# Patient Record
Sex: Male | Born: 1940 | Race: White | Hispanic: No | Smoking: Former smoker
Health system: Southern US, Community
[De-identification: ages and names within clinical notes are randomized; demographics above are authoritative.]

## PROBLEM LIST (undated history)

## (undated) DIAGNOSIS — I10 Essential (primary) hypertension: Secondary | ICD-10-CM

## (undated) DIAGNOSIS — F329 Major depressive disorder, single episode, unspecified: Secondary | ICD-10-CM

## (undated) DIAGNOSIS — N189 Chronic kidney disease, unspecified: Secondary | ICD-10-CM

## (undated) DIAGNOSIS — E785 Hyperlipidemia, unspecified: Secondary | ICD-10-CM

## (undated) DIAGNOSIS — K219 Gastro-esophageal reflux disease without esophagitis: Secondary | ICD-10-CM

## (undated) DIAGNOSIS — F32A Depression, unspecified: Secondary | ICD-10-CM

## (undated) DIAGNOSIS — E119 Type 2 diabetes mellitus without complications: Secondary | ICD-10-CM

## (undated) HISTORY — DX: Essential (primary) hypertension: I10

## (undated) HISTORY — DX: Depression, unspecified: F32.A

## (undated) HISTORY — PX: TONSILLECTOMY: SUR1361

## (undated) HISTORY — DX: Type 2 diabetes mellitus without complications: E11.9

## (undated) HISTORY — DX: Hyperlipidemia, unspecified: E78.5

## (undated) HISTORY — DX: Major depressive disorder, single episode, unspecified: F32.9

## (undated) HISTORY — DX: Gastro-esophageal reflux disease without esophagitis: K21.9

## (undated) HISTORY — DX: Chronic kidney disease, unspecified: N18.9

---

## 1999-12-24 ENCOUNTER — Encounter: Payer: Self-pay | Admitting: Pulmonary Disease

## 1999-12-24 ENCOUNTER — Ambulatory Visit (HOSPITAL_COMMUNITY): Admission: RE | Admit: 1999-12-24 | Discharge: 1999-12-24 | Payer: Self-pay | Admitting: Pulmonary Disease

## 2001-04-14 ENCOUNTER — Ambulatory Visit (HOSPITAL_COMMUNITY): Admission: RE | Admit: 2001-04-14 | Discharge: 2001-04-14 | Payer: Self-pay | Admitting: Pulmonary Disease

## 2001-04-14 ENCOUNTER — Encounter: Payer: Self-pay | Admitting: Pulmonary Disease

## 2004-09-23 ENCOUNTER — Ambulatory Visit: Payer: Self-pay | Admitting: Pulmonary Disease

## 2005-03-15 ENCOUNTER — Ambulatory Visit: Payer: Self-pay | Admitting: Pulmonary Disease

## 2005-06-21 ENCOUNTER — Ambulatory Visit: Payer: Self-pay | Admitting: Pulmonary Disease

## 2005-07-16 ENCOUNTER — Ambulatory Visit: Payer: Self-pay | Admitting: Pulmonary Disease

## 2007-02-03 ENCOUNTER — Ambulatory Visit: Payer: Self-pay | Admitting: Pulmonary Disease

## 2007-02-03 LAB — CONVERTED CEMR LAB
ALT: 42 units/L (ref 0–53)
AST: 32 units/L (ref 0–37)
Albumin: 4 g/dL (ref 3.5–5.2)
Alkaline Phosphatase: 42 units/L (ref 39–117)
BUN: 17 mg/dL (ref 6–23)
Basophils Absolute: 0 10*3/uL (ref 0.0–0.1)
Basophils Relative: 0.6 % (ref 0.0–1.0)
Bilirubin, Direct: 0.1 mg/dL (ref 0.0–0.3)
CO2: 29 meq/L (ref 19–32)
Calcium: 9.2 mg/dL (ref 8.4–10.5)
Chloride: 107 meq/L (ref 96–112)
Cholesterol: 200 mg/dL (ref 0–200)
Creatinine, Ser: 1.1 mg/dL (ref 0.4–1.5)
Eosinophils Absolute: 0.1 10*3/uL (ref 0.0–0.6)
Eosinophils Relative: 2.5 % (ref 0.0–5.0)
GFR calc Af Amer: 86 mL/min
GFR calc non Af Amer: 71 mL/min
Glucose, Bld: 135 mg/dL — ABNORMAL HIGH (ref 70–99)
HCT: 39.6 % (ref 39.0–52.0)
HDL: 35.1 mg/dL — ABNORMAL LOW (ref >39.0)
Hemoglobin: 13.9 g/dL (ref 13.0–17.0)
Hgb A1c MFr Bld: 5.8 % (ref 4.6–6.0)
LDL Cholesterol: 137 mg/dL — ABNORMAL HIGH (ref 0–99)
Lymphocytes Relative: 40 % (ref 12.0–46.0)
MCHC: 35 g/dL (ref 30.0–36.0)
MCV: 92.6 fL (ref 78.0–100.0)
Monocytes Absolute: 0.4 10*3/uL (ref 0.2–0.7)
Monocytes Relative: 9.2 % (ref 3.0–11.0)
Neutro Abs: 2 10*3/uL (ref 1.4–7.7)
Neutrophils Relative %: 47.7 % (ref 43.0–77.0)
PSA: 0.88 ng/mL (ref 0.10–4.00)
Platelets: 166 10*3/uL (ref 150–400)
Potassium: 4.1 meq/L (ref 3.5–5.1)
RBC: 4.28 M/uL (ref 4.22–5.81)
RDW: 11.9 % (ref 11.5–14.6)
Sodium: 143 meq/L (ref 135–145)
TSH: 0.92 microintl units/mL (ref 0.35–5.50)
Total Bilirubin: 1 mg/dL (ref 0.3–1.2)
Total CHOL/HDL Ratio: 5.7
Total Protein: 6.4 g/dL (ref 6.0–8.3)
Triglycerides: 139 mg/dL (ref 0–149)
VLDL: 28 mg/dL (ref 0–40)
Vit D, 1,25-Dihydroxy: 20 (ref 20–57)
WBC: 4.1 10*3/uL — ABNORMAL LOW (ref 4.5–10.5)

## 2007-07-22 ENCOUNTER — Encounter: Payer: Self-pay | Admitting: Pulmonary Disease

## 2007-10-09 ENCOUNTER — Telehealth: Payer: Self-pay | Admitting: Pulmonary Disease

## 2007-10-23 ENCOUNTER — Ambulatory Visit: Payer: Self-pay | Admitting: Pulmonary Disease

## 2007-10-23 DIAGNOSIS — E7800 Pure hypercholesterolemia, unspecified: Secondary | ICD-10-CM

## 2007-10-23 DIAGNOSIS — M545 Low back pain, unspecified: Secondary | ICD-10-CM | POA: Insufficient documentation

## 2007-10-23 DIAGNOSIS — K219 Gastro-esophageal reflux disease without esophagitis: Secondary | ICD-10-CM

## 2007-10-23 DIAGNOSIS — M1990 Unspecified osteoarthritis, unspecified site: Secondary | ICD-10-CM | POA: Insufficient documentation

## 2007-10-23 DIAGNOSIS — G500 Trigeminal neuralgia: Secondary | ICD-10-CM | POA: Insufficient documentation

## 2007-10-23 DIAGNOSIS — Z9189 Other specified personal risk factors, not elsewhere classified: Secondary | ICD-10-CM | POA: Insufficient documentation

## 2007-10-23 DIAGNOSIS — F339 Major depressive disorder, recurrent, unspecified: Secondary | ICD-10-CM

## 2007-10-23 DIAGNOSIS — M5136 Other intervertebral disc degeneration, lumbar region: Secondary | ICD-10-CM

## 2007-10-23 LAB — CONVERTED CEMR LAB
ALT: 63 units/L — ABNORMAL HIGH (ref 0–53)
AST: 44 units/L — ABNORMAL HIGH (ref 0–37)
Albumin: 4 g/dL (ref 3.5–5.2)
Alkaline Phosphatase: 33 units/L — ABNORMAL LOW (ref 39–117)
BUN: 23 mg/dL (ref 6–23)
Basophils Absolute: 0 10*3/uL (ref 0.0–0.1)
Basophils Relative: 0.5 % (ref 0.0–1.0)
Bilirubin, Direct: 0.2 mg/dL (ref 0.0–0.3)
CO2: 31 meq/L (ref 19–32)
Calcium: 9 mg/dL (ref 8.4–10.5)
Chloride: 107 meq/L (ref 96–112)
Cholesterol: 153 mg/dL (ref 0–200)
Creatinine, Ser: 1 mg/dL (ref 0.4–1.5)
Eosinophils Absolute: 0.1 10*3/uL (ref 0.0–0.7)
Eosinophils Relative: 1.6 % (ref 0.0–5.0)
GFR calc Af Amer: 96 mL/min
GFR calc non Af Amer: 79 mL/min
Glucose, Bld: 132 mg/dL — ABNORMAL HIGH (ref 70–99)
HCT: 42.1 % (ref 39.0–52.0)
HDL: 44.5 mg/dL (ref >39.0)
Hemoglobin: 14.1 g/dL (ref 13.0–17.0)
Hgb A1c MFr Bld: 6.3 % — ABNORMAL HIGH (ref 4.6–6.0)
LDL Cholesterol: 91 mg/dL (ref 0–99)
Lymphocytes Relative: 28.6 % (ref 12.0–46.0)
MCHC: 33.5 g/dL (ref 30.0–36.0)
MCV: 91.8 fL (ref 78.0–100.0)
Monocytes Absolute: 0.5 10*3/uL (ref 0.1–1.0)
Monocytes Relative: 8.5 % (ref 3.0–12.0)
Neutro Abs: 3.3 10*3/uL (ref 1.4–7.7)
Neutrophils Relative %: 60.8 % (ref 43.0–77.0)
PSA: 0.65 ng/mL (ref 0.10–4.00)
Platelets: 188 10*3/uL (ref 150–400)
Potassium: 5.1 meq/L (ref 3.5–5.1)
RBC: 4.59 M/uL (ref 4.22–5.81)
RDW: 12.3 % (ref 11.5–14.6)
Sodium: 143 meq/L (ref 135–145)
TSH: 0.7 microintl units/mL (ref 0.35–5.50)
Total Bilirubin: 0.8 mg/dL (ref 0.3–1.2)
Total CHOL/HDL Ratio: 3.4
Total Protein: 6.7 g/dL (ref 6.0–8.3)
Triglycerides: 88 mg/dL (ref 0–149)
VLDL: 18 mg/dL (ref 0–40)
WBC: 5.4 10*3/uL (ref 4.5–10.5)

## 2007-10-24 ENCOUNTER — Telehealth (INDEPENDENT_AMBULATORY_CARE_PROVIDER_SITE_OTHER): Payer: Self-pay | Admitting: *Deleted

## 2007-10-27 DIAGNOSIS — G43809 Other migraine, not intractable, without status migrainosus: Secondary | ICD-10-CM

## 2007-10-27 DIAGNOSIS — N4889 Other specified disorders of penis: Secondary | ICD-10-CM | POA: Insufficient documentation

## 2007-10-30 ENCOUNTER — Telehealth (INDEPENDENT_AMBULATORY_CARE_PROVIDER_SITE_OTHER): Payer: Self-pay | Admitting: *Deleted

## 2007-12-12 ENCOUNTER — Ambulatory Visit: Payer: Self-pay | Admitting: Endocrinology

## 2007-12-12 DIAGNOSIS — E119 Type 2 diabetes mellitus without complications: Secondary | ICD-10-CM | POA: Insufficient documentation

## 2007-12-12 DIAGNOSIS — R259 Unspecified abnormal involuntary movements: Secondary | ICD-10-CM | POA: Insufficient documentation

## 2008-03-14 ENCOUNTER — Ambulatory Visit: Payer: Self-pay | Admitting: Endocrinology

## 2008-03-14 LAB — CONVERTED CEMR LAB: Hgb A1c MFr Bld: 6.2 % — ABNORMAL HIGH (ref 4.6–6.0)

## 2008-04-30 ENCOUNTER — Ambulatory Visit: Payer: Self-pay | Admitting: Pulmonary Disease

## 2008-04-30 DIAGNOSIS — J209 Acute bronchitis, unspecified: Secondary | ICD-10-CM

## 2008-05-12 DIAGNOSIS — J189 Pneumonia, unspecified organism: Secondary | ICD-10-CM

## 2008-05-12 DIAGNOSIS — E669 Obesity, unspecified: Secondary | ICD-10-CM

## 2008-05-12 LAB — CONVERTED CEMR LAB
ALT: 35 units/L (ref 0–53)
AST: 28 units/L (ref 0–37)
Albumin: 4.2 g/dL (ref 3.5–5.2)
Alkaline Phosphatase: 40 units/L (ref 39–117)
BUN: 19 mg/dL (ref 6–23)
Basophils Absolute: 0.1 10*3/uL (ref 0.0–0.1)
Basophils Relative: 0.9 % (ref 0.0–3.0)
Bilirubin, Direct: 0.2 mg/dL (ref 0.0–0.3)
CO2: 30 meq/L (ref 19–32)
Calcium: 9.7 mg/dL (ref 8.4–10.5)
Chloride: 105 meq/L (ref 96–112)
Creatinine, Ser: 1.2 mg/dL (ref 0.4–1.5)
Eosinophils Absolute: 0.4 10*3/uL (ref 0.0–0.7)
Eosinophils Relative: 5.2 % — ABNORMAL HIGH (ref 0.0–5.0)
GFR calc Af Amer: 78 mL/min
GFR calc non Af Amer: 64 mL/min
Glucose, Bld: 212 mg/dL — ABNORMAL HIGH (ref 70–99)
HCT: 42.1 % (ref 39.0–52.0)
Hemoglobin: 14.6 g/dL (ref 13.0–17.0)
Lymphocytes Relative: 15.6 % (ref 12.0–46.0)
MCHC: 34.6 g/dL (ref 30.0–36.0)
MCV: 93.1 fL (ref 78.0–100.0)
Monocytes Absolute: 0.8 10*3/uL (ref 0.1–1.0)
Monocytes Relative: 9.2 % (ref 3.0–12.0)
Neutro Abs: 5.9 10*3/uL (ref 1.4–7.7)
Neutrophils Relative %: 69.1 % (ref 43.0–77.0)
Platelets: 190 10*3/uL (ref 150–400)
Potassium: 4.8 meq/L (ref 3.5–5.1)
RBC: 4.52 M/uL (ref 4.22–5.81)
RDW: 11.8 % (ref 11.5–14.6)
Sodium: 141 meq/L (ref 135–145)
Total Bilirubin: 0.7 mg/dL (ref 0.3–1.2)
Total Protein: 6.9 g/dL (ref 6.0–8.3)
WBC: 8.5 10*3/uL (ref 4.5–10.5)

## 2008-11-04 ENCOUNTER — Telehealth (INDEPENDENT_AMBULATORY_CARE_PROVIDER_SITE_OTHER): Payer: Self-pay | Admitting: *Deleted

## 2008-11-15 ENCOUNTER — Encounter: Payer: Self-pay | Admitting: Pulmonary Disease

## 2008-11-27 ENCOUNTER — Encounter: Payer: Self-pay | Admitting: Pulmonary Disease

## 2008-12-20 ENCOUNTER — Ambulatory Visit: Payer: Self-pay | Admitting: Endocrinology

## 2008-12-20 LAB — CONVERTED CEMR LAB: Hgb A1c MFr Bld: 6.8 % — ABNORMAL HIGH (ref 4.6–6.5)

## 2009-01-08 ENCOUNTER — Ambulatory Visit: Payer: Self-pay | Admitting: Internal Medicine

## 2009-01-08 ENCOUNTER — Telehealth (INDEPENDENT_AMBULATORY_CARE_PROVIDER_SITE_OTHER): Payer: Self-pay | Admitting: *Deleted

## 2009-01-08 DIAGNOSIS — R05 Cough: Secondary | ICD-10-CM

## 2009-01-08 DIAGNOSIS — R5383 Other fatigue: Secondary | ICD-10-CM

## 2009-01-08 DIAGNOSIS — R5381 Other malaise: Secondary | ICD-10-CM

## 2009-01-08 DIAGNOSIS — R351 Nocturia: Secondary | ICD-10-CM

## 2009-01-09 ENCOUNTER — Ambulatory Visit: Payer: Self-pay | Admitting: Internal Medicine

## 2009-01-09 LAB — CONVERTED CEMR LAB
ALT: 27 units/L (ref 0–53)
AST: 27 units/L (ref 0–37)
Albumin: 3.9 g/dL (ref 3.5–5.2)
Basophils Relative: 0.9 % (ref 0.0–3.0)
Eosinophils Relative: 0.7 % (ref 0.0–5.0)
GFR calc non Af Amer: 63.91 mL/min (ref >60)
Glucose, Bld: 84 mg/dL (ref 70–99)
HCT: 40.8 % (ref 39.0–52.0)
Hemoglobin: 14.1 g/dL (ref 13.0–17.0)
Ketones, ur: NEGATIVE mg/dL
Leukocytes, UA: NEGATIVE
Lymphs Abs: 1.8 10*3/uL (ref 0.7–4.0)
Monocytes Relative: 10.2 % (ref 3.0–12.0)
Neutro Abs: 4.9 10*3/uL (ref 1.4–7.7)
Potassium: 4.7 meq/L (ref 3.5–5.1)
Sodium: 144 meq/L (ref 135–145)
TSH: 0.72 microintl units/mL (ref 0.35–5.50)
Urobilinogen, UA: 0.2 (ref 0.0–1.0)
WBC: 7.7 10*3/uL (ref 4.5–10.5)

## 2009-01-14 ENCOUNTER — Encounter: Payer: Self-pay | Admitting: Pulmonary Disease

## 2009-03-04 ENCOUNTER — Ambulatory Visit: Payer: Self-pay | Admitting: Endocrinology

## 2009-04-04 ENCOUNTER — Encounter: Payer: Self-pay | Admitting: Pulmonary Disease

## 2009-04-15 ENCOUNTER — Telehealth (INDEPENDENT_AMBULATORY_CARE_PROVIDER_SITE_OTHER): Payer: Self-pay | Admitting: *Deleted

## 2009-04-24 ENCOUNTER — Ambulatory Visit: Payer: Self-pay | Admitting: Pulmonary Disease

## 2009-04-24 DIAGNOSIS — K5730 Diverticulosis of large intestine without perforation or abscess without bleeding: Secondary | ICD-10-CM | POA: Insufficient documentation

## 2009-06-02 ENCOUNTER — Telehealth: Payer: Self-pay | Admitting: Pulmonary Disease

## 2009-07-25 ENCOUNTER — Encounter: Payer: Self-pay | Admitting: Pulmonary Disease

## 2010-02-26 ENCOUNTER — Ambulatory Visit: Payer: Self-pay | Admitting: Endocrinology

## 2010-02-26 LAB — CONVERTED CEMR LAB
Creatinine,U: 79.3 mg/dL
Hgb A1c MFr Bld: 5.9 % (ref 4.6–6.5)
Microalb Creat Ratio: 1.4 mg/g (ref 0.0–30.0)

## 2010-03-10 ENCOUNTER — Encounter: Payer: Self-pay | Admitting: Pulmonary Disease

## 2010-08-18 NOTE — Letter (Signed)
Summary: [facility]  [facility]   Imported By: Lester [place] 08/16/2009 11:31:26  _____________________________________________________________________  External Attachment:    Type:   Image     Comment:   External Document

## 2010-08-18 NOTE — Assessment & Plan Note (Signed)
Summary: F/U APPT/#/CD   Vital Signs:  Patient profile:   70 year old male Height:      69 inches (175.26 cm) Weight:      211.38 pounds (96.08 kg) BMI:     31.33 O2 Sat:      95 % on Room air Temp:     98.0 degrees F (36.67 degrees C) oral Pulse rate:   68 / minute BP sitting:   122 / 72  (left arm) Cuff size:   regular  Vitals Entered By: Brenton Grills MA (February 26, 2010 4:07 PM)  O2 Flow:  Room air CC: F/U appt/aj Is Patient Diabetic? Yes   CC:  F/U appt/aj.  History of Present Illness: no cbg record, but states cbg's were well-controlled, until he ran out of meds for a few days.  pt states he feels well in general, except for back pain.  he would like to minimixe his cost of actos by reducing dosage, if possible.  Current Medications (verified): 1)  Simvastatin 40 Mg  Tabs (Simvastatin) .... Take One Tablet By Mouth At Bedtime 2)  Metformin Hcl 500 Mg Xr24h-Tab (Metformin Hcl) .... 2 Bid 3)  Actos 45 Mg Tabs (Pioglitazone Hcl) .Marland Kitchen.. 1 Qd 4)  Prilosec 20 Mg  Cpdr (Omeprazole) .... Take 1 Tablet By Mouth Once Daily 5)  Trazodone Hcl 300 Mg  Tabs (Trazodone Hcl) .... Take One Tablet By Mouth At Bedtime 6)  Zoloft 100 Mg  Tabs (Sertraline Hcl) .... Take 1 Tablet By Mouth Once A Day 7)  Tramadol Hcl 50 Mg Tabs (Tramadol Hcl) .... 2 By Mouth Two Times A Day 8)  Etodolac 400 Mg Tabs (Etodolac) .Marland Kitchen.. 1 By Mouth Two Times A Day 9)  Tegretol 200 Mg Tabs (Carbamazepine) .... Take One Tablet By Mouth Two Times A Day As Directed  Allergies (verified): No Known Drug Allergies  Past History:  Past Medical History: Last updated: 04/24/2009 ACUTE BRONCHITIS (ICD-466.0) Hx of PNEUMONIA (ICD-486) CHEST WALL PAIN, HX OF (ICD-V15.89) HYPERCHOLESTEROLEMIA (ICD-272.0) DIABETES MELLITUS (ICD-250.00) OBESITY (ICD-278.00) GERD (ICD-530.81) DIVERTICULOSIS OF COLON (ICD-562.10) ? 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  Physical Exam  General:  normal appearance.   Psych:  Alert and cooperative; normal mood and affect; normal attention span and concentration.   Additional Exam:  Hemoglobin A1C            5.9    Impression & Recommendations:  Problem # 1:  DIABETES MELLITUS (ICD-250.00) well-controlled  Medications Added to Medication List This Visit: 1)  Actos 45 Mg Tabs (Pioglitazone hcl) .Marland Kitchen.. 1 every other day 2)  Tramadol Hcl 50 Mg Tabs (Tramadol hcl) .... 2 by mouth two times a day  Other Orders: TLB-A1C / Hgb A1C (Glycohemoglobin) (83036-A1C) TLB-Microalbumin/Creat Ratio, Urine (82043-MALB) Est. Patient Level III ([address])  Patient Instructions: 1)  blood tests are being ordered for you today.  please call [phone] to hear your test results. 2)  pending the test results, please continue the same medications for now. 3)  (update: i left message on phone-tree:  reduce actos to 45 mg every other day.  ret 6 mos). Prescriptions: ACTOS 45 MG TABS (PIOGLITAZONE HCL) 1 every other day  #45 x 0  Entered and Authorized by:   Minus Breeding MD   Signed by:   Minus Breeding MD on 02/26/2010   Method used:   Faxed to ...       [facility] (mail-order)             , [place]         Ph: [phone]       Fax: [phone]   RxID:   [id number] ACTOS 45 MG TABS (PIOGLITAZONE HCL) 1 qd  #90 x 3   Entered and Authorized by:   Minus Breeding MD   Signed by:   Minus Breeding MD on 02/26/2010   Method used:   Faxed to ...       [facility] (mail-order)             , [place]         Ph: [phone]       Fax: [phone]   RxID:   [id number] METFORMIN HCL 500 MG XR24H-TAB (METFORMIN HCL) 2 bid  #360 x 3   Entered and Authorized by:   Minus Breeding MD   Signed by:   Minus Breeding MD on 02/26/2010   Method used:   Faxed to ...       [facility] (mail-order)             , [place]         Ph: [phone]       Fax: [phone]   RxID:   [id number]

## 2010-08-18 NOTE — Letter (Signed)
Summary: [facility]  [facility]   Imported By: Lester [place] 03/25/2010 09:35:21  _____________________________________________________________________  External Attachment:    Type:   Image     Comment:   External Document

## 2013-07-19 HISTORY — PX: ANTERIOR CERVICAL DISCECTOMY: SHX1160

## 2013-08-02 DIAGNOSIS — Z23 Encounter for immunization: Secondary | ICD-10-CM | POA: Diagnosis not present

## 2013-09-14 DIAGNOSIS — M542 Cervicalgia: Secondary | ICD-10-CM | POA: Diagnosis not present

## 2013-09-14 DIAGNOSIS — R6889 Other general symptoms and signs: Secondary | ICD-10-CM | POA: Diagnosis not present

## 2013-09-14 DIAGNOSIS — E119 Type 2 diabetes mellitus without complications: Secondary | ICD-10-CM | POA: Diagnosis not present

## 2013-09-14 DIAGNOSIS — E782 Mixed hyperlipidemia: Secondary | ICD-10-CM | POA: Diagnosis not present

## 2013-10-15 DIAGNOSIS — D1801 Hemangioma of skin and subcutaneous tissue: Secondary | ICD-10-CM | POA: Diagnosis not present

## 2013-10-15 DIAGNOSIS — I781 Nevus, non-neoplastic: Secondary | ICD-10-CM | POA: Diagnosis not present

## 2013-10-15 DIAGNOSIS — L578 Other skin changes due to chronic exposure to nonionizing radiation: Secondary | ICD-10-CM | POA: Diagnosis not present

## 2013-10-15 DIAGNOSIS — D239 Other benign neoplasm of skin, unspecified: Secondary | ICD-10-CM | POA: Diagnosis not present

## 2013-10-15 DIAGNOSIS — L819 Disorder of pigmentation, unspecified: Secondary | ICD-10-CM | POA: Diagnosis not present

## 2013-11-09 DIAGNOSIS — M5412 Radiculopathy, cervical region: Secondary | ICD-10-CM | POA: Diagnosis not present

## 2013-11-13 DIAGNOSIS — M5412 Radiculopathy, cervical region: Secondary | ICD-10-CM | POA: Diagnosis not present

## 2013-11-13 DIAGNOSIS — L570 Actinic keratosis: Secondary | ICD-10-CM | POA: Diagnosis not present

## 2013-11-28 DIAGNOSIS — J069 Acute upper respiratory infection, unspecified: Secondary | ICD-10-CM | POA: Diagnosis not present

## 2013-11-29 DIAGNOSIS — M5020 Other cervical disc displacement, unspecified cervical region: Secondary | ICD-10-CM | POA: Diagnosis not present

## 2013-12-24 DIAGNOSIS — M5412 Radiculopathy, cervical region: Secondary | ICD-10-CM | POA: Diagnosis not present

## 2013-12-24 DIAGNOSIS — M4802 Spinal stenosis, cervical region: Secondary | ICD-10-CM | POA: Diagnosis not present

## 2013-12-24 DIAGNOSIS — M47812 Spondylosis without myelopathy or radiculopathy, cervical region: Secondary | ICD-10-CM | POA: Diagnosis not present

## 2013-12-27 DIAGNOSIS — M4802 Spinal stenosis, cervical region: Secondary | ICD-10-CM | POA: Diagnosis not present

## 2013-12-27 DIAGNOSIS — M4712 Other spondylosis with myelopathy, cervical region: Secondary | ICD-10-CM | POA: Diagnosis not present

## 2013-12-27 DIAGNOSIS — Z01810 Encounter for preprocedural cardiovascular examination: Secondary | ICD-10-CM | POA: Diagnosis not present

## 2013-12-27 DIAGNOSIS — E119 Type 2 diabetes mellitus without complications: Secondary | ICD-10-CM | POA: Diagnosis not present

## 2013-12-27 DIAGNOSIS — E782 Mixed hyperlipidemia: Secondary | ICD-10-CM | POA: Diagnosis not present

## 2013-12-27 DIAGNOSIS — Z01818 Encounter for other preprocedural examination: Secondary | ICD-10-CM | POA: Diagnosis not present

## 2014-01-01 DIAGNOSIS — M5412 Radiculopathy, cervical region: Secondary | ICD-10-CM | POA: Diagnosis not present

## 2014-01-01 DIAGNOSIS — M47812 Spondylosis without myelopathy or radiculopathy, cervical region: Secondary | ICD-10-CM | POA: Diagnosis not present

## 2014-01-01 DIAGNOSIS — M4802 Spinal stenosis, cervical region: Secondary | ICD-10-CM | POA: Diagnosis not present

## 2014-01-02 DIAGNOSIS — F329 Major depressive disorder, single episode, unspecified: Secondary | ICD-10-CM | POA: Diagnosis present

## 2014-01-02 DIAGNOSIS — M47812 Spondylosis without myelopathy or radiculopathy, cervical region: Secondary | ICD-10-CM | POA: Diagnosis not present

## 2014-01-02 DIAGNOSIS — R52 Pain, unspecified: Secondary | ICD-10-CM | POA: Diagnosis not present

## 2014-01-02 DIAGNOSIS — F3289 Other specified depressive episodes: Secondary | ICD-10-CM | POA: Diagnosis present

## 2014-01-02 DIAGNOSIS — M5412 Radiculopathy, cervical region: Secondary | ICD-10-CM | POA: Diagnosis not present

## 2014-01-02 DIAGNOSIS — R030 Elevated blood-pressure reading, without diagnosis of hypertension: Secondary | ICD-10-CM | POA: Diagnosis not present

## 2014-01-02 DIAGNOSIS — I10 Essential (primary) hypertension: Secondary | ICD-10-CM | POA: Diagnosis not present

## 2014-01-02 DIAGNOSIS — B3781 Candidal esophagitis: Secondary | ICD-10-CM | POA: Diagnosis not present

## 2014-01-02 DIAGNOSIS — M4802 Spinal stenosis, cervical region: Secondary | ICD-10-CM | POA: Diagnosis not present

## 2014-01-02 DIAGNOSIS — E119 Type 2 diabetes mellitus without complications: Secondary | ICD-10-CM | POA: Diagnosis not present

## 2014-01-02 DIAGNOSIS — M479 Spondylosis, unspecified: Secondary | ICD-10-CM | POA: Diagnosis not present

## 2014-01-02 DIAGNOSIS — M4712 Other spondylosis with myelopathy, cervical region: Secondary | ICD-10-CM | POA: Diagnosis not present

## 2014-01-07 DIAGNOSIS — R262 Difficulty in walking, not elsewhere classified: Secondary | ICD-10-CM | POA: Diagnosis not present

## 2014-01-07 DIAGNOSIS — E119 Type 2 diabetes mellitus without complications: Secondary | ICD-10-CM | POA: Diagnosis not present

## 2014-01-07 DIAGNOSIS — M5030 Other cervical disc degeneration, unspecified cervical region: Secondary | ICD-10-CM | POA: Diagnosis not present

## 2014-01-07 DIAGNOSIS — F329 Major depressive disorder, single episode, unspecified: Secondary | ICD-10-CM | POA: Diagnosis not present

## 2014-01-07 DIAGNOSIS — Z4789 Encounter for other orthopedic aftercare: Secondary | ICD-10-CM | POA: Diagnosis not present

## 2014-01-07 DIAGNOSIS — B370 Candidal stomatitis: Secondary | ICD-10-CM | POA: Diagnosis not present

## 2014-01-07 DIAGNOSIS — M6281 Muscle weakness (generalized): Secondary | ICD-10-CM | POA: Diagnosis not present

## 2014-01-07 DIAGNOSIS — I10 Essential (primary) hypertension: Secondary | ICD-10-CM | POA: Diagnosis not present

## 2014-01-07 DIAGNOSIS — F3289 Other specified depressive episodes: Secondary | ICD-10-CM | POA: Diagnosis not present

## 2014-01-09 DIAGNOSIS — F329 Major depressive disorder, single episode, unspecified: Secondary | ICD-10-CM | POA: Diagnosis not present

## 2014-01-09 DIAGNOSIS — M6281 Muscle weakness (generalized): Secondary | ICD-10-CM | POA: Diagnosis not present

## 2014-01-09 DIAGNOSIS — R262 Difficulty in walking, not elsewhere classified: Secondary | ICD-10-CM | POA: Diagnosis not present

## 2014-01-09 DIAGNOSIS — M5030 Other cervical disc degeneration, unspecified cervical region: Secondary | ICD-10-CM | POA: Diagnosis not present

## 2014-01-09 DIAGNOSIS — E119 Type 2 diabetes mellitus without complications: Secondary | ICD-10-CM | POA: Diagnosis not present

## 2014-01-09 DIAGNOSIS — Z4789 Encounter for other orthopedic aftercare: Secondary | ICD-10-CM | POA: Diagnosis not present

## 2014-01-09 DIAGNOSIS — F3289 Other specified depressive episodes: Secondary | ICD-10-CM | POA: Diagnosis not present

## 2014-01-10 DIAGNOSIS — I10 Essential (primary) hypertension: Secondary | ICD-10-CM | POA: Diagnosis not present

## 2014-01-10 DIAGNOSIS — Z4789 Encounter for other orthopedic aftercare: Secondary | ICD-10-CM | POA: Diagnosis not present

## 2014-01-10 DIAGNOSIS — R262 Difficulty in walking, not elsewhere classified: Secondary | ICD-10-CM | POA: Diagnosis not present

## 2014-01-10 DIAGNOSIS — M6281 Muscle weakness (generalized): Secondary | ICD-10-CM | POA: Diagnosis not present

## 2014-01-10 DIAGNOSIS — R634 Abnormal weight loss: Secondary | ICD-10-CM | POA: Diagnosis not present

## 2014-01-10 DIAGNOSIS — E119 Type 2 diabetes mellitus without complications: Secondary | ICD-10-CM | POA: Diagnosis not present

## 2014-01-10 DIAGNOSIS — M5030 Other cervical disc degeneration, unspecified cervical region: Secondary | ICD-10-CM | POA: Diagnosis not present

## 2014-01-10 DIAGNOSIS — B370 Candidal stomatitis: Secondary | ICD-10-CM | POA: Diagnosis not present

## 2014-01-10 DIAGNOSIS — F329 Major depressive disorder, single episode, unspecified: Secondary | ICD-10-CM | POA: Diagnosis not present

## 2014-01-10 DIAGNOSIS — F3289 Other specified depressive episodes: Secondary | ICD-10-CM | POA: Diagnosis not present

## 2014-01-11 DIAGNOSIS — M5412 Radiculopathy, cervical region: Secondary | ICD-10-CM | POA: Diagnosis not present

## 2014-02-06 DIAGNOSIS — F3289 Other specified depressive episodes: Secondary | ICD-10-CM | POA: Diagnosis not present

## 2014-02-06 DIAGNOSIS — F329 Major depressive disorder, single episode, unspecified: Secondary | ICD-10-CM | POA: Diagnosis not present

## 2014-02-06 DIAGNOSIS — I10 Essential (primary) hypertension: Secondary | ICD-10-CM | POA: Diagnosis not present

## 2014-02-06 DIAGNOSIS — R42 Dizziness and giddiness: Secondary | ICD-10-CM | POA: Diagnosis not present

## 2014-02-14 DIAGNOSIS — M4802 Spinal stenosis, cervical region: Secondary | ICD-10-CM | POA: Diagnosis not present

## 2014-02-21 DIAGNOSIS — G500 Trigeminal neuralgia: Secondary | ICD-10-CM | POA: Diagnosis not present

## 2014-02-21 DIAGNOSIS — M542 Cervicalgia: Secondary | ICD-10-CM | POA: Diagnosis not present

## 2014-04-10 DIAGNOSIS — M4802 Spinal stenosis, cervical region: Secondary | ICD-10-CM | POA: Diagnosis not present

## 2014-04-12 DIAGNOSIS — M4802 Spinal stenosis, cervical region: Secondary | ICD-10-CM | POA: Diagnosis not present

## 2014-04-16 DIAGNOSIS — M4802 Spinal stenosis, cervical region: Secondary | ICD-10-CM | POA: Diagnosis not present

## 2014-04-16 DIAGNOSIS — M549 Dorsalgia, unspecified: Secondary | ICD-10-CM | POA: Diagnosis not present

## 2014-04-19 DIAGNOSIS — M4806 Spinal stenosis, lumbar region: Secondary | ICD-10-CM | POA: Diagnosis not present

## 2014-04-19 DIAGNOSIS — M5136 Other intervertebral disc degeneration, lumbar region: Secondary | ICD-10-CM | POA: Diagnosis not present

## 2014-04-30 DIAGNOSIS — M4806 Spinal stenosis, lumbar region: Secondary | ICD-10-CM | POA: Diagnosis not present

## 2014-04-30 DIAGNOSIS — M5416 Radiculopathy, lumbar region: Secondary | ICD-10-CM | POA: Diagnosis not present

## 2014-04-30 DIAGNOSIS — M47896 Other spondylosis, lumbar region: Secondary | ICD-10-CM | POA: Diagnosis not present

## 2014-05-15 DIAGNOSIS — E119 Type 2 diabetes mellitus without complications: Secondary | ICD-10-CM | POA: Diagnosis not present

## 2014-05-15 DIAGNOSIS — E785 Hyperlipidemia, unspecified: Secondary | ICD-10-CM | POA: Diagnosis not present

## 2014-05-15 DIAGNOSIS — F419 Anxiety disorder, unspecified: Secondary | ICD-10-CM | POA: Diagnosis not present

## 2014-05-15 DIAGNOSIS — I10 Essential (primary) hypertension: Secondary | ICD-10-CM | POA: Diagnosis not present

## 2014-06-24 ENCOUNTER — Telehealth: Payer: Self-pay | Admitting: Pulmonary Disease

## 2014-06-24 NOTE — Telephone Encounter (Signed)
SN please advise if you would like to schedule an appt for this pt.  thanks

## 2014-06-25 NOTE — Telephone Encounter (Signed)
lmcb x1

## 2014-06-25 NOTE — Telephone Encounter (Signed)
Pt's wife returned call - [phone].

## 2014-06-25 NOTE — Telephone Encounter (Signed)
lmtcb x1 

## 2014-06-25 NOTE — Telephone Encounter (Signed)
Per SN--  Explain to him that SN is partially retired now and doing pulmonary---SN recs primary care with either Dr. Jenny Reichmann or Dr. Doug Sou.   thanks

## 2014-06-26 NOTE — Telephone Encounter (Signed)
Spoke with wife-she is aware of SN seeing pulmonary now and will contact [place] to schedule OV with Dr Jenny Reichmann or Dr Doug Sou. Nothing more needed at this time.

## 2014-07-01 DIAGNOSIS — M4803 Spinal stenosis, cervicothoracic region: Secondary | ICD-10-CM | POA: Diagnosis not present

## 2014-07-04 DIAGNOSIS — M4803 Spinal stenosis, cervicothoracic region: Secondary | ICD-10-CM | POA: Diagnosis not present

## 2014-07-16 DIAGNOSIS — M4803 Spinal stenosis, cervicothoracic region: Secondary | ICD-10-CM | POA: Diagnosis not present

## 2014-07-18 DIAGNOSIS — M4803 Spinal stenosis, cervicothoracic region: Secondary | ICD-10-CM | POA: Diagnosis not present

## 2014-07-19 DIAGNOSIS — M4803 Spinal stenosis, cervicothoracic region: Secondary | ICD-10-CM | POA: Diagnosis not present

## 2014-07-22 DIAGNOSIS — M4803 Spinal stenosis, cervicothoracic region: Secondary | ICD-10-CM | POA: Diagnosis not present

## 2014-08-27 ENCOUNTER — Encounter: Payer: Self-pay | Admitting: Internal Medicine

## 2014-08-27 ENCOUNTER — Other Ambulatory Visit (INDEPENDENT_AMBULATORY_CARE_PROVIDER_SITE_OTHER): Payer: Medicare Other

## 2014-08-27 ENCOUNTER — Ambulatory Visit (INDEPENDENT_AMBULATORY_CARE_PROVIDER_SITE_OTHER): Payer: Medicare Other | Admitting: Internal Medicine

## 2014-08-27 VITALS — BP 108/68 | HR 100 | Temp 98.5°F | Resp 16 | Ht 69.0 in | Wt 203.8 lb

## 2014-08-27 DIAGNOSIS — E7800 Pure hypercholesterolemia, unspecified: Secondary | ICD-10-CM

## 2014-08-27 DIAGNOSIS — E119 Type 2 diabetes mellitus without complications: Secondary | ICD-10-CM

## 2014-08-27 DIAGNOSIS — F329 Major depressive disorder, single episode, unspecified: Secondary | ICD-10-CM

## 2014-08-27 DIAGNOSIS — F32A Depression, unspecified: Secondary | ICD-10-CM

## 2014-08-27 DIAGNOSIS — E785 Hyperlipidemia, unspecified: Secondary | ICD-10-CM | POA: Diagnosis not present

## 2014-08-27 DIAGNOSIS — K219 Gastro-esophageal reflux disease without esophagitis: Secondary | ICD-10-CM

## 2014-08-27 DIAGNOSIS — I10 Essential (primary) hypertension: Secondary | ICD-10-CM | POA: Diagnosis not present

## 2014-08-27 DIAGNOSIS — B356 Tinea cruris: Secondary | ICD-10-CM

## 2014-08-27 LAB — LIPID PANEL
Cholesterol: 129 mg/dL (ref 0–200)
HDL: 34.2 mg/dL — AB (ref >39.00)
NonHDL: 94.8
TRIGLYCERIDES: 340 mg/dL — AB (ref 0.0–149.0)
Total CHOL/HDL Ratio: 4
VLDL: 68 mg/dL — ABNORMAL HIGH (ref 0.0–40.0)

## 2014-08-27 LAB — BASIC METABOLIC PANEL
BUN: 35 mg/dL — AB (ref 6–23)
CALCIUM: 9.5 mg/dL (ref 8.4–10.5)
CO2: 30 meq/L (ref 19–32)
CREATININE: 1.16 mg/dL (ref 0.40–1.50)
Chloride: 97 mEq/L (ref 96–112)
GFR: 65.4 mL/min (ref >60.00)
Glucose, Bld: 179 mg/dL — ABNORMAL HIGH (ref 70–99)
POTASSIUM: 3.7 meq/L (ref 3.5–5.1)
Sodium: 135 mEq/L (ref 135–145)

## 2014-08-27 LAB — HEMOGLOBIN A1C: Hgb A1c MFr Bld: 6.7 % — ABNORMAL HIGH (ref 4.6–6.5)

## 2014-08-27 LAB — LDL CHOLESTEROL, DIRECT: Direct LDL: 70 mg/dL

## 2014-08-27 MED ORDER — FLUCONAZOLE 150 MG PO TABS: 150.0000 mg | ORAL_TABLET | ORAL | Status: DC

## 2014-08-27 NOTE — Patient Instructions (Signed)
We will send in a medicine for the jock itch called fluconazole. Take 1 pill today, then take 1 pill every 3 days until gone (5 pills).  We will check on the blood work today and call you back with the results. If your diabetes is well controlled you likely do not need to see an endocrinologist right now.   It is fine for you to see a dermatologist and the doctor for the neck. If you need any referrals please let us know.   We will see you back in about 6 months if you are doing well. If you are having any problems please feel free to call us sooner.  Diabetes and Exercise Exercising regularly is important. It is not just about losing weight. It has many health benefits, such as:  Improving your overall fitness, flexibility, and endurance.  Increasing your bone density.  Helping with weight control.  Decreasing your body fat.  Increasing your muscle strength.  Reducing stress and tension.  Improving your overall health. People with diabetes who exercise gain additional benefits because exercise:  Reduces appetite.  Improves the body's use of blood sugar (glucose).  Helps lower or control blood glucose.  Decreases blood pressure.  Helps control blood lipids (such as cholesterol and triglycerides).  Improves the body's use of the hormone insulin by:  Increasing the body's insulin sensitivity.  Reducing the body's insulin needs.  Decreases the risk for heart disease because exercising:  Lowers cholesterol and triglycerides levels.  Increases the levels of good cholesterol (such as high-density lipoproteins [HDL]) in the body.  Lowers blood glucose levels. YOUR ACTIVITY PLAN  Choose an activity that you enjoy and set realistic goals. Your health care provider or diabetes educator can help you make an activity plan that works for you. Exercise regularly as directed by your health care provider. This includes:  Performing resistance training twice a week such as  push-ups, sit-ups, lifting weights, or using resistance bands.  Performing 150 minutes of cardio exercises each week such as walking, running, or playing sports.  Staying active and spending no more than 90 minutes at one time being inactive. Even short bursts of exercise are good for you. Three 10-minute sessions spread throughout the day are just as beneficial as a single 30-minute session. Some exercise ideas include:  Taking the dog for a walk.  Taking the stairs instead of the elevator.  Dancing to your favorite song.  Doing an exercise video.  Doing your favorite exercise with a friend. RECOMMENDATIONS FOR EXERCISING WITH TYPE 1 OR TYPE 2 DIABETES   Check your blood glucose before exercising. If blood glucose levels are greater than 240 mg/dL, check for urine ketones. Do not exercise if ketones are present.  Avoid injecting insulin into areas of the body that are going to be exercised. For example, avoid injecting insulin into:  The arms when playing tennis.  The legs when jogging.  Keep a record of:  Food intake before and after you exercise.  Expected peak times of insulin action.  Blood glucose levels before and after you exercise.  The type and amount of exercise you have done.  Review your records with your health care provider. Your health care provider will help you to develop guidelines for adjusting food intake and insulin amounts before and after exercising.  If you take insulin or oral hypoglycemic agents, watch for signs and symptoms of hypoglycemia. They include:  Dizziness.  Shaking.  Sweating.  Chills.  Confusion.  Drink plenty  of water while you exercise to prevent dehydration or heat stroke. Body water is lost during exercise and must be replaced.  Talk to your health care provider before starting an exercise program to make sure it is safe for you. Remember, almost any type of activity is better than none. Document Released: 09/25/2003  Document Revised: 11/19/2013 Document Reviewed: 12/12/2012 [facility] Patient Information 2015 [place], [place]. This information is not intended to replace advice given to you by your health care provider. Make sure you discuss any questions you have with your health care provider.

## 2014-08-27 NOTE — Progress Notes (Signed)
Pre visit review using our clinic review tool, if applicable. No additional management support is needed unless otherwise documented below in the visit note. 

## 2014-08-28 ENCOUNTER — Encounter: Payer: Self-pay | Admitting: Internal Medicine

## 2014-08-28 DIAGNOSIS — I10 Essential (primary) hypertension: Secondary | ICD-10-CM | POA: Insufficient documentation

## 2014-08-28 DIAGNOSIS — B356 Tinea cruris: Secondary | ICD-10-CM | POA: Insufficient documentation

## 2014-08-28 NOTE — Assessment & Plan Note (Signed)
Continue chlorthalidone for now. Check BMP and adjust as needed. May be worthwhile to switch to ACE-I in the future given concurrent diabetes but he did not wish to switch today.

## 2014-08-28 NOTE — Assessment & Plan Note (Signed)
Doing well with prilosec and he does get symptoms if he misses doses. Continue.

## 2014-08-28 NOTE — Assessment & Plan Note (Signed)
Check lipid panel today, on statin. Without side effects currently.

## 2014-08-28 NOTE — Progress Notes (Signed)
   Subjective:    Patient ID: Austin Bray, male    DOB: 21-Apr-1941, 74 y.o.   MRN: [id number]  HPI The patient is a 74 YO man who is coming in today with jock itch. He has had the rash and itch on the crease by his legs for about 1-2 months. He tried the goldbond cream over the counter and it would get better for 1-2 days then came right back when he stopped using it. He denies fevers, swelling, discharge or lesion on penis or scrotum. Please see A/P for status and treatment of chronic medication problems.   PMH, [place], social history, PSH, allergies, medications, problem list reviewed and updated.   Review of Systems  Constitutional: Negative for fever, activity change, appetite change, fatigue and unexpected weight change.  HENT: Negative.   Eyes: Negative.   Respiratory: Negative for cough, chest tightness and shortness of breath.   Cardiovascular: Negative for chest pain, palpitations and leg swelling.  Gastrointestinal: Negative for abdominal pain, diarrhea, constipation and abdominal distention.  Endocrine: Negative.   Musculoskeletal: Negative.   Skin: Positive for rash.  Neurological: Negative.   Psychiatric/Behavioral: Negative.       Objective:   Physical Exam  Constitutional: He is oriented to person, place, and time. He appears well-developed and well-nourished.  Overweight  HENT:  Head: Normocephalic and atraumatic.  Eyes: EOM are normal.  Neck: Normal range of motion.  Cardiovascular: Normal rate and regular rhythm.   Pulmonary/Chest: Effort normal and breath sounds normal.  Abdominal: Soft. Bowel sounds are normal.  Musculoskeletal: He exhibits no edema.  Neurological: He is alert and oriented to person, place, and time. Coordination normal.  Skin: Skin is warm and dry.  Red rash with satellite lesions on the inguinal creases   Filed Vitals:   08/27/14 1517  BP: 108/68  Pulse: 100  Temp: 98.5 F (36.9 C)  TempSrc: Oral  Resp: 16  Height: 5\' 9"  (1.753 m)   Weight: 203 lb 12.8 oz (92.443 kg)  SpO2: 96%      Assessment & Plan:

## 2014-08-28 NOTE — Assessment & Plan Note (Signed)
Yeast and given concurrent diabetes will treat with fluconazole.

## 2014-08-28 NOTE — Assessment & Plan Note (Signed)
No known complications, well controlled in the past. Foot exam done at visit. Reminded about the eye exams. Check HgA1c today. He is on metformin and not on ACE-I or ARB.

## 2014-08-28 NOTE — Assessment & Plan Note (Signed)
Doing well on zoloft. Continue therapy. He has tried to come off in the past with recurrence of his depression.

## 2014-11-08 DIAGNOSIS — J209 Acute bronchitis, unspecified: Secondary | ICD-10-CM | POA: Diagnosis not present

## 2014-11-12 ENCOUNTER — Encounter: Payer: Self-pay | Admitting: Emergency Medicine

## 2014-11-12 ENCOUNTER — Emergency Department
Admit: 2014-11-12 | Disposition: A | Discharge status: Home / Self Care | Payer: Self-pay | Admitting: Emergency Medicine

## 2014-11-12 DIAGNOSIS — R739 Hyperglycemia, unspecified: Secondary | ICD-10-CM | POA: Diagnosis not present

## 2014-11-12 DIAGNOSIS — R197 Diarrhea, unspecified: Secondary | ICD-10-CM | POA: Diagnosis not present

## 2014-11-12 DIAGNOSIS — Z79891 Long term (current) use of opiate analgesic: Secondary | ICD-10-CM | POA: Diagnosis not present

## 2014-11-12 DIAGNOSIS — Z79899 Other long term (current) drug therapy: Secondary | ICD-10-CM | POA: Diagnosis not present

## 2014-11-12 DIAGNOSIS — Z791 Long term (current) use of non-steroidal anti-inflammatories (NSAID): Secondary | ICD-10-CM | POA: Diagnosis not present

## 2014-11-12 DIAGNOSIS — E1165 Type 2 diabetes mellitus with hyperglycemia: Secondary | ICD-10-CM | POA: Diagnosis not present

## 2014-11-12 DIAGNOSIS — R5383 Other fatigue: Secondary | ICD-10-CM | POA: Diagnosis not present

## 2014-11-12 DIAGNOSIS — J209 Acute bronchitis, unspecified: Secondary | ICD-10-CM | POA: Diagnosis not present

## 2014-11-12 DIAGNOSIS — R531 Weakness: Secondary | ICD-10-CM | POA: Diagnosis not present

## 2014-11-12 DIAGNOSIS — E860 Dehydration: Secondary | ICD-10-CM | POA: Diagnosis not present

## 2014-11-12 DIAGNOSIS — I10 Essential (primary) hypertension: Secondary | ICD-10-CM | POA: Diagnosis not present

## 2014-11-12 LAB — CBC WITH DIFFERENTIAL/PLATELET
BASOS ABS: 0 10*3/uL (ref 0.0–0.1)
Basophil %: 0.6 %
EOS ABS: 0.1 10*3/uL (ref 0.0–0.7)
Eosinophil %: 2.6 %
HCT: 39.6 % — ABNORMAL LOW (ref 40.0–52.0)
HGB: 13.8 g/dL (ref 13.0–18.0)
LYMPHS ABS: 1.5 10*3/uL (ref 1.0–3.6)
Lymphocyte %: 27.4 %
MCH: 31 pg (ref 26.0–34.0)
MCHC: 34.9 g/dL (ref 32.0–36.0)
MCV: 89 fL (ref 80–100)
Monocyte #: 0.6 x10 3/mm (ref 0.2–1.0)
Monocyte %: 11.2 %
NEUTROS ABS: 3.2 10*3/uL (ref 1.4–6.5)
Neutrophil %: 58.2 %
PLATELETS: 188 10*3/uL (ref 150–440)
RBC: 4.46 10*6/uL (ref 4.40–5.90)
RDW: 12.5 % (ref 11.5–14.5)
WBC: 5.5 10*3/uL (ref 3.8–10.6)

## 2014-11-12 LAB — BASIC METABOLIC PANEL
ANION GAP: 10 (ref 7–16)
BUN: 32 mg/dL — ABNORMAL HIGH
CALCIUM: 9.2 mg/dL
CHLORIDE: 97 mmol/L — AB
Co2: 27 mmol/L
Creatinine: 1.37 mg/dL — ABNORMAL HIGH
EGFR (Non-African Amer.): 50 — ABNORMAL LOW
GFR CALC AF AMER: 58 — AB
Glucose: 375 mg/dL — ABNORMAL HIGH
POTASSIUM: 3.2 mmol/L — AB
Sodium: 134 mmol/L — ABNORMAL LOW

## 2014-11-12 LAB — URINALYSIS, COMPLETE
BILIRUBIN, UR: NEGATIVE
Bacteria: NONE SEEN
Blood: NEGATIVE
KETONE: NEGATIVE
Leukocyte Esterase: NEGATIVE
Nitrite: NEGATIVE
Ph: 5 (ref 4.5–8.0)
Protein: NEGATIVE
RBC, UR: NONE SEEN /HPF (ref 0–5)
SPECIFIC GRAVITY: 1.02 (ref 1.003–1.030)
SQUAMOUS EPITHELIAL: NONE SEEN

## 2014-11-12 LAB — TROPONIN I: Troponin-I: <0.03 ng/mL

## 2015-01-13 ENCOUNTER — Other Ambulatory Visit: Payer: Self-pay

## 2015-02-25 ENCOUNTER — Ambulatory Visit: Payer: Medicare Other | Admitting: Internal Medicine

## 2015-03-21 DIAGNOSIS — R05 Cough: Secondary | ICD-10-CM | POA: Diagnosis not present

## 2015-03-21 DIAGNOSIS — B3742 Candidal balanitis: Secondary | ICD-10-CM | POA: Diagnosis not present

## 2015-03-21 DIAGNOSIS — M791 Myalgia: Secondary | ICD-10-CM | POA: Diagnosis not present

## 2015-03-21 DIAGNOSIS — R0982 Postnasal drip: Secondary | ICD-10-CM | POA: Diagnosis not present

## 2015-04-21 ENCOUNTER — Ambulatory Visit (INDEPENDENT_AMBULATORY_CARE_PROVIDER_SITE_OTHER): Payer: Medicare Other | Admitting: Family Medicine

## 2015-04-21 ENCOUNTER — Encounter: Payer: Self-pay | Admitting: Family Medicine

## 2015-04-21 VITALS — BP 128/72 | HR 76 | Temp 98.8°F | Ht 67.87 in | Wt 203.8 lb

## 2015-04-21 DIAGNOSIS — K219 Gastro-esophageal reflux disease without esophagitis: Secondary | ICD-10-CM

## 2015-04-21 DIAGNOSIS — R350 Frequency of micturition: Secondary | ICD-10-CM | POA: Diagnosis not present

## 2015-04-21 DIAGNOSIS — R252 Cramp and spasm: Secondary | ICD-10-CM

## 2015-04-21 DIAGNOSIS — E119 Type 2 diabetes mellitus without complications: Secondary | ICD-10-CM

## 2015-04-21 DIAGNOSIS — R197 Diarrhea, unspecified: Secondary | ICD-10-CM | POA: Insufficient documentation

## 2015-04-21 DIAGNOSIS — M5136 Other intervertebral disc degeneration, lumbar region: Secondary | ICD-10-CM

## 2015-04-21 DIAGNOSIS — E785 Hyperlipidemia, unspecified: Secondary | ICD-10-CM | POA: Diagnosis not present

## 2015-04-21 LAB — COMPREHENSIVE METABOLIC PANEL
ALT: 22 U/L (ref 0–53)
AST: 22 U/L (ref 0–37)
Albumin: 4.4 g/dL (ref 3.5–5.2)
Alkaline Phosphatase: 37 U/L — ABNORMAL LOW (ref 39–117)
BUN: 27 mg/dL — ABNORMAL HIGH (ref 6–23)
CALCIUM: 9.7 mg/dL (ref 8.4–10.5)
CHLORIDE: 98 meq/L (ref 96–112)
CO2: 30 mEq/L (ref 19–32)
Creatinine, Ser: 1.25 mg/dL (ref 0.40–1.50)
GFR: 59.89 mL/min — ABNORMAL LOW (ref >60.00)
Glucose, Bld: 147 mg/dL — ABNORMAL HIGH (ref 70–99)
POTASSIUM: 4 meq/L (ref 3.5–5.1)
Sodium: 141 mEq/L (ref 135–145)
Total Bilirubin: 0.6 mg/dL (ref 0.2–1.2)
Total Protein: 7 g/dL (ref 6.0–8.3)

## 2015-04-21 LAB — POCT URINALYSIS DIPSTICK
Glucose, UA: NEGATIVE
Leukocytes, UA: NEGATIVE
Nitrite, UA: NEGATIVE
RBC UA: NEGATIVE
SPEC GRAV UA: 1.025
UROBILINOGEN UA: 0.2
pH, UA: 5.5

## 2015-04-21 LAB — TSH: TSH: 0.89 u[IU]/mL (ref 0.35–4.50)

## 2015-04-21 LAB — MAGNESIUM: Magnesium: 1.5 mg/dL (ref 1.5–2.5)

## 2015-04-21 LAB — HEMOGLOBIN A1C: HEMOGLOBIN A1C: 6.4 % (ref 4.6–6.5)

## 2015-04-21 NOTE — Progress Notes (Addendum)
Patient ID: CIRE CLUTE, male   DOB: Jun 01, 1941, 74 y.o.   MRN: [id number]  Tommi Rumps, MD Phone: [phone]  ANCHOR DWAN is a 74 y.o. male who presents today for f/u. Transferring care from [facility] to our office.   DIABETES Disease Monitoring: Blood Sugar ranges-150-170 Polyuria/phagia/dipsia- some polyuria and polydipsia, notes some dry mouth      Visual problems- no, last saw optho 2 years ago Medications: Compliance- taking metformin Hypoglycemic symptoms- no Notes he has had issues with his blood sugar being 170's and having some irritation and tremor with this. Last episode was last week. No neurological symptoms with this. Resolves quickly. Previously sugars were better controlled.  Previously on actos, though last took this 3 years ago.   Frequent urination: notes this is not that significant. No nocturia. Notes unable to tell us how frequently. Notes always has something to drink. Stream is ok, though not as strong as previously. No starting or stopping. Empties bladder fully. No recent prostate check.   Diarrhea: notes 5 year history of this. 3-4x/day. No abdominal pain or N/V with this. Passes lots of gas with this. Has done lots of traveling out of the country ([place], [place], [place], [place]). No abnormal water sources. No well water. No sick contacts. Has been stable for 5 years. Notes started on metformin about 5 years ago.   Foot and leg cramps: occurs intermittently for the past couple of years. Not as intense over the past 3-4 months. Improve with stretching. Nothing in particular brings these on. Stands up and they will go away. Notes infrequently legs will move on their own mostly when he is about to fall asleep.   Low back pain: chronic issue. No pain at this time. Had fusion attempt at some point in the past. No numbness, weakness, saddle anesthesia, incontinence, fever, or history of cancer.   GERD: has been on PPI for 20 years. Had single episode of  burning in throat last week with sour taste, other wise no symptoms in prior 6 months. No abdominal pain. No blood in stool. Avoids exacerbating foods.    Active Ambulatory Problems    Diagnosis Date Noted  . Diabetes mellitus type 2, controlled ([place]) 12/12/2007  . HYPERCHOLESTEROLEMIA 10/23/2007  . OBESITY 05/12/2008  . Depression 10/23/2007  . 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 GERD 10/23/2007  . DDD (degenerative disc disease), lumbar 10/23/2007  . NOCTURIA 01/08/2009  . Essential hypertension 08/28/2014  . Jock itch 08/28/2014  . Frequent urination 04/21/2015  . Diarrhea 04/21/2015  . Muscle cramps 04/21/2015   Resolved Ambulatory Problems    Diagnosis Date Noted  . OCULAR MIGRAINE 10/27/2007  . Acute bronchitis 04/30/2008  . PNEUMONIA 05/12/2008  . DIVERTICULOSIS OF COLON 04/24/2009  . PEYRONIE'S DISEASE 10/27/2007  . DEGENERATIVE JOINT DISEASE 10/23/2007  . BACK PAIN, LUMBAR 10/23/2007  . FATIGUE 01/08/2009  . TREMOR 12/12/2007  . Cough 01/08/2009  . CHEST WALL PAIN, HX OF 10/23/2007   Past Medical History  Diagnosis Date  . Diabetes mellitus without complication ([place])   . Hypertension   . Hyperlipidemia   . GERD (gastroesophageal reflux disease)   . Chronic kidney disease     Family History  Problem Relation Age of Onset  . Dementia Mother   . Hypertension Father   . Hyperlipidemia Father   . Cancer Maternal Grandmother   . Dementia Maternal Grandfather   . Cancer Paternal Grandmother     colon  . Heart disease Paternal Grandfather  Social History   Social History  . Marital Status: Married    Spouse Name: N/A  . Number of Children: N/A  . Years of Education: N/A   Occupational History  . Not on file.   Social History Main Topics  . Smoking status: Former Research scientist (life sciences)  . Smokeless tobacco: Not on file  . Alcohol Use: 4.2 oz/week    7 Shots of liquor per week  . Drug Use: No  . Sexual Activity: Not on file   Other Topics Concern  . Not on  file   Social History Narrative    ROS   General:  Negative for unexplained weight loss, fever Skin: Negative for new or changing mole, sore that won't heal HEENT: Negative for trouble hearing, trouble seeing, ringing in ears, mouth sores, hoarseness, change in voice, dysphagia. CV:  Negative for chest pain, dyspnea, edema, palpitations Resp: Negative for cough, dyspnea, hemoptysis GI: Positive for diarrhea, Negative for nausea, vomiting, constipation, abdominal pain, melena, hematochezia. GU: Positive for frequent urination, Negative for dysuria, incontinence, urinary hesitance, hematuria, vaginal or penile discharge, polyuria, sexual difficulty, lumps in testicle or breasts MSK: Positive for muscle cramps or aches, negative joint pain or swelling Neuro: Negative for headaches, weakness, numbness, dizziness, passing out/fainting Psych: Negative for depression, anxiety, memory problems  Objective  Physical Exam Filed Vitals:   04/21/15 0858  BP: 128/72  Pulse: 76  Temp: 98.8 F (37.1 C)    Physical Exam  Constitutional: He is well-developed, well-nourished, and in no distress.  HENT:  Head: Normocephalic and atraumatic.  Right Ear: External ear normal.  Left Ear: External ear normal.  Mouth/Throat: Oropharynx is clear and moist. No oropharyngeal exudate.  Eyes: Conjunctivae are normal. Pupils are equal, round, and reactive to light.  Neck: Neck supple.  Cardiovascular: Normal rate, regular rhythm and normal heart sounds.  Exam reveals no gallop and no friction rub.   No murmur heard. Pulmonary/Chest: Effort normal and breath sounds normal. No respiratory distress. He has no wheezes. He has no rales.  Abdominal: Soft. Bowel sounds are normal. He exhibits no distension and no mass. There is no tenderness. There is no rebound and no guarding.  Genitourinary: Prostate normal.  Musculoskeletal: He exhibits no edema.  No apparent spasm in LE or feet bilaterally No tenderness of  feet, calves, or thighs No erythema or swelling of feet, calves, or thighs  Lymphadenopathy:    He has no cervical adenopathy.  Neurological: He is alert. Gait normal.  CN 2-12 intact, 5/5 strength in bilateral biceps, triceps, grip, quads, hamstrings, plantar and dorsiflexion, sensation to light touch intact in bilateral UE and LE, normal gait, 2+ patellar reflexes  Skin: Skin is warm and dry. He is not diaphoretic.  Psychiatric: Mood and affect normal.     Assessment/Plan:   DDD (degenerative disc disease), lumbar Stable. No complaints. No red flags. Neuro intact. Continue to monitor. Given return precautions.   Diabetes mellitus type 2, controlled CBGs reportedly worsened. No known complications. Suspect irritation and tremor are due to cbgs being higher than usual. Neurologically intact. No hypoglycemia. Will check CMET for renal function. Advised on eye exam. A1c today as well. Continue metformin at this time. Will consider change in this if stool studies are negative.   GERD Stable. Has been on PPI for a long time. Discussed risk of long term PPI use. Will trial off this. If symptoms recur will restart and consider GI evaluation given long history of GERD.   Frequent urination Reports  frequent urination. Prostate exam normal and notes no significant prostate symptoms. Suspect due to his higher than normal cbgs. Will check UA today. Will check A1c and CMET.  Diarrhea Chronic issue. Benign abdominal exam. Gas with this. No other symptoms. Appears well hydrated. Could be related to metformin. Has traveled extensively so could be infectious etiology. Will check stool studies to rule out infectious cause. If negative will need to consider change in diabetic therapy. Given return precautions.   Muscle cramps Occurs in LE intermittently. Could be dehydration related to diarrhea, though appears well hydrated. Could be electrolyte imbalance. Benign exam today. Neuro intact. Will check CMET,  TSH, and magnesium.     Orders Placed This Encounter  Procedures  . Stool Culture    Standing Status: Future     Number of Occurrences:      Standing Expiration Date: 04/20/2016  . Ova and parasite examination    Standing Status: Future     Number of Occurrences:      Standing Expiration Date: 04/20/2016  . Stool C-Diff Toxin Assay    Standing Status: Future     Number of Occurrences:      Standing Expiration Date: 04/20/2016  . Giardia, EIA; Ova/Parasite    Standing Status: Future     Number of Occurrences:      Standing Expiration Date: 04/20/2016  . HgB A1c  . Direct LDL  . Comp Met (CMET)  . Magnesium  . TSH  . Stool, WBC/Lactoferrin    Standing Status: Future     Number of Occurrences:      Standing Expiration Date: 04/20/2016  . POCT Urinalysis Dipstick    Tommi Rumps

## 2015-04-21 NOTE — Assessment & Plan Note (Addendum)
Reports frequent urination. Prostate exam normal and notes no significant prostate symptoms. Suspect due to his higher than normal cbgs. Will check UA today. Will check A1c and CMET.

## 2015-04-21 NOTE — Patient Instructions (Signed)
Nice to meet you. We will check blood work today and call with the results. If you have abdominal pain, blood in your stool, vomiting, diarrhea, back pain, incontinence, numbness, weakness, fever, or feel poorly please seek medical attention.

## 2015-04-21 NOTE — Assessment & Plan Note (Addendum)
Chronic issue. Benign abdominal exam. Gas with this. No other symptoms. Appears well hydrated. Could be related to metformin. Has traveled extensively so could be infectious etiology. Will check stool studies to rule out infectious cause. If negative will need to consider change in diabetic therapy. Given return precautions.

## 2015-04-21 NOTE — Assessment & Plan Note (Signed)
Stable. No complaints. No red flags. Neuro intact. Continue to monitor. Given return precautions.

## 2015-04-21 NOTE — Assessment & Plan Note (Signed)
Stable. Has been on PPI for a long time. Discussed risk of long term PPI use. Will trial off this. If symptoms recur will restart and consider GI evaluation given long history of GERD.

## 2015-04-21 NOTE — Assessment & Plan Note (Addendum)
Occurs in LE intermittently. Could be dehydration related to diarrhea, though appears well hydrated. Could be electrolyte imbalance. Benign exam today. Neuro intact. Will check CMET, TSH, and magnesium.

## 2015-04-21 NOTE — Assessment & Plan Note (Addendum)
CBGs reportedly worsened. No known complications. Suspect irritation and tremor are due to cbgs being higher than usual. Neurologically intact. No hypoglycemia. Will check CMET for renal function. Advised on eye exam. A1c today as well. Continue metformin at this time. Will consider change in this if stool studies are negative.

## 2015-04-21 NOTE — Progress Notes (Signed)
Pre visit review using our clinic review tool, if applicable. No additional management support is needed unless otherwise documented below in the visit note. 

## 2015-04-22 LAB — LDL CHOLESTEROL, DIRECT: Direct LDL: 73 mg/dL

## 2015-04-24 ENCOUNTER — Other Ambulatory Visit: Payer: Self-pay | Admitting: Family Medicine

## 2015-04-24 DIAGNOSIS — R809 Proteinuria, unspecified: Secondary | ICD-10-CM

## 2015-04-28 ENCOUNTER — Other Ambulatory Visit (INDEPENDENT_AMBULATORY_CARE_PROVIDER_SITE_OTHER): Payer: Medicare Other

## 2015-04-28 ENCOUNTER — Encounter (INDEPENDENT_AMBULATORY_CARE_PROVIDER_SITE_OTHER): Payer: Self-pay

## 2015-04-28 DIAGNOSIS — R197 Diarrhea, unspecified: Secondary | ICD-10-CM | POA: Diagnosis not present

## 2015-04-28 DIAGNOSIS — R809 Proteinuria, unspecified: Secondary | ICD-10-CM

## 2015-04-28 LAB — POCT URINALYSIS DIPSTICK
Glucose, UA: NEGATIVE
Ketones, UA: 15
Leukocytes, UA: NEGATIVE
NITRITE UA: NEGATIVE
PROTEIN UA: 30
RBC UA: NEGATIVE
SPEC GRAV UA: 1.025
UROBILINOGEN UA: 0.2
pH, UA: 5.5

## 2015-04-29 DIAGNOSIS — R197 Diarrhea, unspecified: Secondary | ICD-10-CM | POA: Diagnosis not present

## 2015-04-29 LAB — FECAL LACTOFERRIN, QUANT: Lactoferrin: NEGATIVE

## 2015-05-06 DIAGNOSIS — L570 Actinic keratosis: Secondary | ICD-10-CM | POA: Diagnosis not present

## 2015-05-07 ENCOUNTER — Telehealth: Payer: Self-pay | Admitting: Family Medicine

## 2015-05-07 DIAGNOSIS — R197 Diarrhea, unspecified: Secondary | ICD-10-CM

## 2015-05-07 DIAGNOSIS — R809 Proteinuria, unspecified: Secondary | ICD-10-CM

## 2015-05-07 DIAGNOSIS — R822 Biliuria: Secondary | ICD-10-CM

## 2015-05-07 LAB — TEST CODE CHANGE

## 2015-05-07 LAB — SPECIMEN STATUS REPORT

## 2015-05-07 LAB — OVA AND PARASITE EXAMINATION

## 2015-05-07 LAB — STOOL CULTURE: E coli, Shiga toxin Assay: NEGATIVE

## 2015-05-07 LAB — O+P EXAM, FORMALIN ONLY

## 2015-05-07 LAB — CLOSTRIDIUM DIFFICILE BY PCR

## 2015-05-07 LAB — GIARDIA, EIA; OVA/PARASITE: GIARDIA AG STL: NEGATIVE

## 2015-05-07 NOTE — Telephone Encounter (Signed)
Called patient to discuss lab results. Advised him that the lab was unable to run the stool studies that were previously collected. These been canceled by the ancillary lab. I advised I'm going to discuss this with our lab employee this afternoon to sort out why this occurred. He will need to come in to get the correct supplies to provide [place] with a new sample. Also discussed the urinalysis results with the patient and advise of the bilirubin in the urine and protein in the urine. Think he would benefit from right upper quadrant ultrasound to evaluate his liver given his bilirubin in his urine. He'll also benefit from 24-hour urine protein collection. I'll place these orders so these can be arranged for the patient.

## 2015-05-07 NOTE — Telephone Encounter (Signed)
Just called [facility] and the "cancelled" orders are actually resulted, there was two different req. Number and they got confused, the are faxing over the results now

## 2015-05-08 NOTE — Telephone Encounter (Signed)
-----   Message from Leone Haven, MD sent at 05/08/2015  1:47 PM EDT ----- Patient still needs to repeat the c diff testing. The other tests that were reordered can be canceled. He needs to do a 24 hour urine protein as well. Could you please let the patient know when to come by and pick up the materials for this. Thanks. Randall Hiss.   ----- Message -----    From: Lilyan Punt, CMA    Sent: 04/28/2015  11:07 AM      To: Leone Haven, MD

## 2015-05-08 NOTE — Telephone Encounter (Signed)
Called pt let a a message for pt to call back

## 2015-05-09 ENCOUNTER — Other Ambulatory Visit: Payer: Self-pay | Admitting: Family Medicine

## 2015-05-09 ENCOUNTER — Other Ambulatory Visit: Payer: Self-pay | Admitting: *Deleted

## 2015-05-09 ENCOUNTER — Ambulatory Visit: Payer: Medicare Other

## 2015-05-09 DIAGNOSIS — R197 Diarrhea, unspecified: Secondary | ICD-10-CM

## 2015-05-10 LAB — C. DIFFICILE GDH AND TOXIN A/B
C. difficile GDH: NOT DETECTED
C. difficile Toxin A/B: NOT DETECTED

## 2015-05-12 ENCOUNTER — Other Ambulatory Visit: Payer: Self-pay | Admitting: *Deleted

## 2015-05-12 DIAGNOSIS — R809 Proteinuria, unspecified: Secondary | ICD-10-CM

## 2015-05-13 NOTE — Telephone Encounter (Signed)
Can you call the patient to see when he is going to come in to pick these materials up. He does not need to complete the c diff testing as this apparently got completed over the weekend. He just needs to do the 24 hour urine collection. Thanks.

## 2015-05-14 LAB — PROTEIN, URINE, 24 HOUR
PROTEIN 24H UR: 102 mg/(24.h) (ref <150)
Protein, Urine: 6 mg/dL (ref 5–25)

## 2015-05-15 ENCOUNTER — Encounter: Payer: Self-pay | Admitting: Family Medicine

## 2015-05-15 NOTE — Telephone Encounter (Signed)
Labs have been completed

## 2015-05-19 ENCOUNTER — Ambulatory Visit
Admission: RE | Admit: 2015-05-19 | Discharge: 2015-05-19 | Disposition: A | Discharge status: Home / Self Care | Payer: Medicare Other | Source: Ambulatory Visit | Attending: Family Medicine | Admitting: Family Medicine

## 2015-05-19 DIAGNOSIS — K760 Fatty (change of) liver, not elsewhere classified: Secondary | ICD-10-CM | POA: Insufficient documentation

## 2015-05-19 DIAGNOSIS — R822 Biliuria: Secondary | ICD-10-CM

## 2015-05-20 ENCOUNTER — Encounter: Payer: Self-pay | Admitting: Family Medicine

## 2015-05-20 ENCOUNTER — Ambulatory Visit (INDEPENDENT_AMBULATORY_CARE_PROVIDER_SITE_OTHER): Payer: Medicare Other | Admitting: Family Medicine

## 2015-05-20 VITALS — BP 130/68 | HR 74 | Temp 98.7°F | Ht 67.87 in

## 2015-05-20 DIAGNOSIS — R05 Cough: Secondary | ICD-10-CM

## 2015-05-20 DIAGNOSIS — M4322 Fusion of spine, cervical region: Secondary | ICD-10-CM

## 2015-05-20 DIAGNOSIS — K760 Fatty (change of) liver, not elsewhere classified: Secondary | ICD-10-CM | POA: Diagnosis not present

## 2015-05-20 DIAGNOSIS — R197 Diarrhea, unspecified: Secondary | ICD-10-CM

## 2015-05-20 DIAGNOSIS — Z981 Arthrodesis status: Secondary | ICD-10-CM | POA: Diagnosis not present

## 2015-05-20 DIAGNOSIS — R059 Cough, unspecified: Secondary | ICD-10-CM | POA: Insufficient documentation

## 2015-05-20 LAB — HM DIABETES EYE EXAM

## 2015-05-20 NOTE — Progress Notes (Signed)
Pre visit review using our clinic review tool, if applicable. No additional management support is needed unless otherwise documented below in the visit note. 

## 2015-05-20 NOTE — Assessment & Plan Note (Signed)
Chronic issue. Patient with benign abdominal exam today. No other symptoms. Appears well. Infectious workup has been negative. Suspect this is related to his metformin. Patient is to check to see if he is on metformin XR once he gets home as this is what we have in our system though patient thinks he is on regular dose metformin. If he is on regular metformin we will switch him to metformin XR to see if this helps. If he is on metformin XR we'll have a discussion about what diabetic medication switch him too. He is given return precautions

## 2015-05-20 NOTE — Assessment & Plan Note (Signed)
Patient with history of cervical spine fusion. Has had intermittent mild neck discomfort with this. He is neurologically intact. Has full range of motion of the neck at this time. Patient's requesting neurosurgical evaluation as he was previously being monitored every 6 months by them. We will refer to neurosurgery. Given return precautions.

## 2015-05-20 NOTE — Progress Notes (Signed)
Patient ID: BRALIN GARRY, male   DOB: 12-29-1940, 74 y.o.   MRN: [id number]  Tommi Rumps, MD Phone: [phone]  ANTHONYJAMES BARGAR is a 74 y.o. male who presents today for follow-up.  Fatty liver: Patient presents for follow-up to discuss recent ultrasound results. Ultrasound was obtained as he had bilirubin in his urine. Ultrasound revealed fatty liver. Patient notes no history of hepatitis. He denies alcohol abuse. Notes he has one drink every once in a while. He does have hyperlipidemia, diabetes, and hypertension. We are working to get these under control.  Diarrhea: Patient had recent workup for persistent diarrhea to rule out infectious causes. Infectious results came back negative. He denies any abdominal pain, blood in his stool, nausea, and vomiting. He does note some gas with this. He has continued to have intermittent diarrhea that alternates with soft stools. He has been on metformin for some time. No other new medications at this time.  Cough: Patient notes nonproductive cough for 2-3 weeks. Notes this started with sore throat and some postnasal drip. It is worse at night. Notes his throat remains sore but is unsure if he still has postnasal drip. He has had a history of heartburn in the past. He does note he forgot to take Prilosec early last month and had an increase in his heartburn at that time. He notes this is burning sensation in his throat and a sour taste in the back of his mouth. He notes he restarted the Prilosec and this resolved the heartburn. He has not had heartburn recently. He does not have a history of asthma. He has not had any chest pain or shortness of breath with this cough. He denies any nasal or sinus congestion with this. He does have a history of allergic rhinitis. He has been intermittently using Flonase and an albuterol inhaler though has not used these on a daily basis.  Cervical spine fusion: Patient notes history of cervical spine fusion many years ago.  He notes throughout the years he is intermittently had discomfort in his neck. This does not radiate anywhere. He notes currently no discomfort in his neck though has had this recently in the lateral right aspect of his posterior neck. No discomfort at this time. No symptoms in his arms. He has not had any numbness or weakness. No lower extremity symptoms.  PMH: Former smoker.   ROS see history of present illness  Objective  Physical Exam Filed Vitals:   05/20/15 0817  BP: 130/68  Pulse: 74  Temp: 98.7 F (37.1 C)    Physical Exam  Constitutional: He is well-developed, well-nourished, and in no distress.  HENT:  Head: Normocephalic and atraumatic.  Eyes: Conjunctivae are normal. Pupils are equal, round, and reactive to light.  Neck: Normal range of motion. Neck supple.  Cardiovascular: Normal rate, regular rhythm and normal heart sounds.  Exam reveals no gallop and no friction rub.   No murmur heard. Pulmonary/Chest: Effort normal and breath sounds normal. No respiratory distress. He has no wheezes. He has no rales.  Abdominal: Soft. Bowel sounds are normal. He exhibits no distension. There is no tenderness. There is no rebound and no guarding.  Musculoskeletal: He exhibits no edema.  No midline spine tenderness, no midline neck tenderness, no step-off, no muscular tenderness of the neck, no swelling or erythema of the neck  Lymphadenopathy:    He has no cervical adenopathy.  Neurological: He is alert. Gait normal.  5 out of 5 strength in bilateral biceps,  triceps, grip, quads, hamstrings, plantar and dorsiflexion, sensation to light touch intact in bilateral UE and LE, normal gait, 2+ patellar reflexes  Skin: Skin is warm and dry. He is not diaphoretic.     Assessment/Plan: Please see individual problem list.  Diarrhea Chronic issue. Patient with benign abdominal exam today. No other symptoms. Appears well. Infectious workup has been negative. Suspect this is related to his  metformin. Patient is to check to see if he is on metformin XR once he gets home as this is what we have in our system though patient thinks he is on regular dose metformin. If he is on regular metformin we will switch him to metformin XR to see if this helps. If he is on metformin XR we'll have a discussion about what diabetic medication switch him too. He is given return precautions  Fatty liver Diagnosed on recent ultrasound. We're managing risk factors of diabetes, hyperlipidemia, and hypertension. We will check a hepatitis panel to determine what vaccines patient needs. We'll continue to monitor.  History of fusion of cervical spine Patient with history of cervical spine fusion. Has had intermittent mild neck discomfort with this. He is neurologically intact. Has full range of motion of the neck at this time. Patient's requesting neurosurgical evaluation as he was previously being monitored every 6 months by them. We will refer to neurosurgery. Given return precautions.  Cough Patient with 2-3 weeks of cough preceded by postnasal drip and sore throat. Suspect this is related to allergic rhinitis, though could be related to his reflux. His vitals are stable. His oxygen saturation is normal. He has normal lung sounds today. Doubt pulmonary process given normal exam and normal oxygenation. We'll treat with Flonase 2 sprays in each nostril daily. He will continue his Prilosec. If this is not improved with daily Flonase use would consider increasing Prilosec dose or switching to Protonix. Patient was given return precautions.    Orders Placed This Encounter  Procedures  . Hepatitis C Antibody  . Hepatitis B Core Antibody, total  . Hepatitis B Surface AntiBODY  . Hepatitis B surface antigen  . Hepatitis A Ab, Total  . Ambulatory referral to Neurosurgery    Referral Priority:  Routine    Referral Type:  Surgical    Referral Reason:  Specialty Services Required    Requested Specialty:   Neurosurgery    Number of Visits Requested:  1    Tommi Rumps

## 2015-05-20 NOTE — Assessment & Plan Note (Signed)
Patient with 2-3 weeks of cough preceded by postnasal drip and sore throat. Suspect this is related to allergic rhinitis, though could be related to his reflux. His vitals are stable. His oxygen saturation is normal. He has normal lung sounds today. Doubt pulmonary process given normal exam and normal oxygenation. We'll treat with Flonase 2 sprays in each nostril daily. He will continue his Prilosec. If this is not improved with daily Flonase use would consider increasing Prilosec dose or switching to Protonix. Patient was given return precautions.

## 2015-05-20 NOTE — Assessment & Plan Note (Signed)
Diagnosed on recent ultrasound. We're managing risk factors of diabetes, hyperlipidemia, and hypertension. We will check a hepatitis panel to determine what vaccines patient needs. We'll continue to monitor.

## 2015-05-20 NOTE — Patient Instructions (Addendum)
Nice to meet you. Please call us to let us know what type of metformin you take. If it is the XR version we will try another medication. Please use the flonase 2 sprays in each nostril once daily to help with your cough.  We will call with your lab results. If he develops abdominal pain, chest pain, shortness of breath, blood in her stool, blood in her productive cough, numbness, weakness, tingling, increased neck pain, or fever please seek medical attention.

## 2015-05-21 ENCOUNTER — Encounter: Payer: Self-pay | Admitting: Surgical

## 2015-05-21 LAB — HEPATITIS C ANTIBODY: HCV AB: NEGATIVE

## 2015-05-21 LAB — HEPATITIS B SURFACE ANTIGEN: Hepatitis B Surface Ag: NEGATIVE

## 2015-05-21 LAB — HEPATITIS B SURFACE ANTIBODY,QUALITATIVE: HEP B S AB: NEGATIVE

## 2015-05-21 LAB — HEPATITIS A ANTIBODY, TOTAL: Hep A Total Ab: NONREACTIVE

## 2015-05-21 LAB — HEPATITIS B CORE ANTIBODY, TOTAL: HEP B C TOTAL AB: NONREACTIVE

## 2015-05-27 ENCOUNTER — Ambulatory Visit (INDEPENDENT_AMBULATORY_CARE_PROVIDER_SITE_OTHER): Payer: Medicare Other | Admitting: *Deleted

## 2015-05-27 DIAGNOSIS — Z23 Encounter for immunization: Secondary | ICD-10-CM

## 2015-06-03 ENCOUNTER — Telehealth: Payer: Self-pay | Admitting: Surgical

## 2015-06-03 ENCOUNTER — Telehealth: Payer: Self-pay | Admitting: *Deleted

## 2015-06-03 MED ORDER — TRAZODONE HCL 150 MG PO TABS: 300.0000 mg | ORAL_TABLET | Freq: Every day | ORAL | Status: DC

## 2015-06-03 NOTE — Addendum Note (Signed)
Addended by: Leone Haven on: 06/03/2015 06:53 PM   Modules accepted: Orders

## 2015-06-03 NOTE — Telephone Encounter (Signed)
Attempted to call patient to let him know that his trazodone was sent to the pharmacy and to discuss options for a new medication for diabetes. There was no answer. Will attempt to call tomorrow.

## 2015-06-03 NOTE — Telephone Encounter (Signed)
Patient's wife called and said that patient is out of the Trazodone and needs some sent to local pharmacy and then regular RX sent to [facility]. Also she wanted to know if you was going to change the Metformin due to it being extended release. If so he needs another RX sent in.

## 2015-06-04 MED ORDER — GLIPIZIDE 5 MG PO TABS: 2.5000 mg | ORAL_TABLET | Freq: Every day | ORAL | Status: DC

## 2015-06-04 MED ORDER — TRAZODONE HCL 150 MG PO TABS: 300.0000 mg | ORAL_TABLET | Freq: Every day | ORAL | Status: DC

## 2015-06-04 NOTE — Addendum Note (Signed)
Addended by: Caryl Bis Camylle Whicker G on: 06/04/2015 11:29 AM   Modules accepted: Orders, Medications

## 2015-06-04 NOTE — Telephone Encounter (Signed)
Called and spoke with patient. Advised that isn't his trazodone into the pharmacy. He confirmed that he takes 300 mg nightly. Initially discuss changing diabetic medication, though on review of labs most recent A1c was 6.4. I initially discussed starting the patient on glipizide and place his metformin, though called back and discussed with patient the option of diet and exercise given that his A1c is prediabetic at this time. Patient opted for discontinuing metformin and working on diet and exercise. I called the pharmacy and canceled the glipizide prescription.  He'll monitor his blood sugars fasting daily. He will also monitor one other time per day varying which meals he checks it before. Is advised that if he drops below 80 he should call the office. Also advised that if his blood sugars start to increase or greater than 200 he should call the office to inform us.

## 2015-06-09 ENCOUNTER — Other Ambulatory Visit: Payer: Self-pay

## 2015-06-09 MED ORDER — ETODOLAC 400 MG PO TABS: 400.0000 mg | ORAL_TABLET | Freq: Every day | ORAL | Status: DC

## 2015-06-09 MED ORDER — OMEPRAZOLE 20 MG PO CPDR: 20.0000 mg | DELAYED_RELEASE_CAPSULE | Freq: Every day | ORAL | Status: DC

## 2015-06-09 MED ORDER — SIMVASTATIN 40 MG PO TABS: 40.0000 mg | ORAL_TABLET | Freq: Every day | ORAL | Status: DC

## 2015-06-09 MED ORDER — SERTRALINE HCL 50 MG PO TABS: 50.0000 mg | ORAL_TABLET | Freq: Every day | ORAL | Status: DC

## 2015-06-17 ENCOUNTER — Telehealth: Payer: Self-pay

## 2015-06-17 ENCOUNTER — Other Ambulatory Visit: Payer: Self-pay

## 2015-06-17 DIAGNOSIS — X32XXXA Exposure to sunlight, initial encounter: Secondary | ICD-10-CM | POA: Diagnosis not present

## 2015-06-17 DIAGNOSIS — L570 Actinic keratosis: Secondary | ICD-10-CM | POA: Diagnosis not present

## 2015-06-17 NOTE — Telephone Encounter (Signed)
Please confirm his Lodine prescription orders.  Script says Take 1 tablet (400mg ) by mouth daily. Take two by mouth daily.    thanks

## 2015-06-18 NOTE — Telephone Encounter (Signed)
Please call the patient to confirm his dosing of lodine. Thanks.

## 2015-06-19 ENCOUNTER — Other Ambulatory Visit: Payer: Self-pay

## 2015-06-19 MED ORDER — ETODOLAC 400 MG PO TABS: 400.0000 mg | ORAL_TABLET | Freq: Two times a day (BID) | ORAL | Status: DC

## 2015-06-19 NOTE — Telephone Encounter (Signed)
Patient takes Lodine one 400 mg tablet by mouth twice a day.

## 2015-06-19 NOTE — Telephone Encounter (Signed)
Resent the [facility] to [facility].

## 2015-06-20 NOTE — Telephone Encounter (Signed)
Patient wanted to make sure a 90 day supply has been sent over.

## 2015-08-06 DIAGNOSIS — J209 Acute bronchitis, unspecified: Secondary | ICD-10-CM | POA: Diagnosis not present

## 2015-09-17 ENCOUNTER — Telehealth: Payer: Self-pay | Admitting: *Deleted

## 2015-09-17 MED ORDER — GLUCOSE BLOOD VI STRP: ORAL_STRIP | Status: DC

## 2015-09-17 MED ORDER — TRAZODONE HCL 150 MG PO TABS: 300.0000 mg | ORAL_TABLET | Freq: Every day | ORAL | Status: DC

## 2015-09-17 NOTE — Telephone Encounter (Signed)
Patient requested a medication refill for test strips for his A1c machine , he request 100 count. He also requested trazodone 90 supply go to [facility].   Machine: contour  plus  Pharmacy [facility]

## 2015-10-02 DIAGNOSIS — D485 Neoplasm of uncertain behavior of skin: Secondary | ICD-10-CM | POA: Diagnosis not present

## 2015-10-02 DIAGNOSIS — X32XXXA Exposure to sunlight, initial encounter: Secondary | ICD-10-CM | POA: Diagnosis not present

## 2015-10-02 DIAGNOSIS — L814 Other melanin hyperpigmentation: Secondary | ICD-10-CM | POA: Diagnosis not present

## 2015-10-02 DIAGNOSIS — L570 Actinic keratosis: Secondary | ICD-10-CM | POA: Diagnosis not present

## 2015-10-02 DIAGNOSIS — D044 Carcinoma in situ of skin of scalp and neck: Secondary | ICD-10-CM | POA: Diagnosis not present

## 2015-10-22 MED ORDER — GLUCOSE BLOOD VI STRP: ORAL_STRIP | Status: DC

## 2015-10-22 MED ORDER — ETODOLAC 400 MG PO TABS: 400.0000 mg | ORAL_TABLET | Freq: Two times a day (BID) | ORAL | Status: DC

## 2015-10-22 MED ORDER — TRAZODONE HCL 150 MG PO TABS: 300.0000 mg | ORAL_TABLET | Freq: Every day | ORAL | Status: DC

## 2015-10-22 NOTE — Telephone Encounter (Signed)
I have completed her request.  

## 2015-10-22 NOTE — Telephone Encounter (Signed)
Pt wife called to check the status of the medication list in previous note. One other medication is etodolac (LODINE) 400 MG tablet this med needs to be a 90 day. [facility] [place], [place]. Wife says if the test strips can be called into [id number] [place], [place]. Call pt @ [phone]. Thank you!

## 2015-10-23 ENCOUNTER — Other Ambulatory Visit: Payer: Self-pay | Admitting: Surgical

## 2015-10-23 MED ORDER — ETODOLAC 400 MG PO TABS: 400.0000 mg | ORAL_TABLET | Freq: Two times a day (BID) | ORAL | Status: DC

## 2015-10-23 MED ORDER — TRAZODONE HCL 150 MG PO TABS: 300.0000 mg | ORAL_TABLET | Freq: Every day | ORAL | Status: DC

## 2015-10-23 MED ORDER — GLUCOSE BLOOD VI STRP: ORAL_STRIP | Status: DC

## 2015-11-11 ENCOUNTER — Ambulatory Visit (INDEPENDENT_AMBULATORY_CARE_PROVIDER_SITE_OTHER): Payer: Medicare Other | Admitting: Family Medicine

## 2015-11-11 ENCOUNTER — Telehealth: Payer: Self-pay | Admitting: Family Medicine

## 2015-11-11 ENCOUNTER — Encounter: Payer: Self-pay | Admitting: Family Medicine

## 2015-11-11 VITALS — BP 130/74 | HR 74 | Temp 98.1°F | Ht 67.87 in | Wt 210.4 lb

## 2015-11-11 DIAGNOSIS — B372 Candidiasis of skin and nail: Secondary | ICD-10-CM

## 2015-11-11 DIAGNOSIS — E119 Type 2 diabetes mellitus without complications: Secondary | ICD-10-CM | POA: Diagnosis not present

## 2015-11-11 LAB — COMPREHENSIVE METABOLIC PANEL
ALBUMIN: 4.2 g/dL (ref 3.5–5.2)
ALK PHOS: 41 U/L (ref 39–117)
ALT: 32 U/L (ref 0–53)
AST: 28 U/L (ref 0–37)
BUN: 21 mg/dL (ref 6–23)
CHLORIDE: 97 meq/L (ref 96–112)
CO2: 29 mEq/L (ref 19–32)
CREATININE: 1.17 mg/dL (ref 0.40–1.50)
Calcium: 9.3 mg/dL (ref 8.4–10.5)
GFR: 64.54 mL/min (ref >60.00)
Glucose, Bld: 251 mg/dL — ABNORMAL HIGH (ref 70–99)
Potassium: 3.5 mEq/L (ref 3.5–5.1)
SODIUM: 135 meq/L (ref 135–145)
TOTAL PROTEIN: 6.7 g/dL (ref 6.0–8.3)
Total Bilirubin: 0.5 mg/dL (ref 0.2–1.2)

## 2015-11-11 LAB — HEMOGLOBIN A1C: Hgb A1c MFr Bld: 8 % — ABNORMAL HIGH (ref 4.6–6.5)

## 2015-11-11 MED ORDER — NYSTATIN 100000 UNIT/GM EX POWD: Freq: Two times a day (BID) | CUTANEOUS | Status: DC

## 2015-11-11 MED ORDER — METFORMIN HCL ER 500 MG PO TB24: 1000.0000 mg | ORAL_TABLET | Freq: Every day | ORAL | Status: DC

## 2015-11-11 NOTE — Telephone Encounter (Signed)
Patient was seen in clinic

## 2015-11-11 NOTE — Patient Instructions (Signed)
Nice to see you. We'll obtain lab work to evaluate your diabetes. We will increase your metformin XR to 1000 mg daily. We will refill the nystatin. You should apply this to the areas of your rash. Please continue to monitor blood sugars. If they do not improve over the next week please let us know. I'll see you back in a month.

## 2015-11-11 NOTE — Progress Notes (Signed)
Pre visit review using our clinic review tool, if applicable. No additional management support is needed unless otherwise documented below in the visit note. 

## 2015-11-11 NOTE — Progress Notes (Signed)
Patient ID: Austin Bray, male   DOB: [birth year], 75 y.o.   MRN: [id number]  Tommi Rumps, MD Phone: [phone]  Austin Bray is a 75 y.o. male who presents today for follow-up.  DIABETES Disease Monitoring: Blood Sugar ranges-114-253, mostly 170s to 190s Polyuria/phagia/dipsia- urinates frequently, no nocturia, no polydipsia or polyphagia      ophthalmology- saw in November of last year Medications: Compliance- taking metformin next are 500 mg daily Hypoglycemic symptoms- no Patient previously was on metformin though had diarrhea that was felt to be possibly related to metformin is advised to stop this given his well controlled A1c. He was supposed to follow up after a month though comes in today for follow-up. He is restarted back on his metformin over the last several weeks. 500 once a day. No fevers or recent infections.  Yeast infection: Patient was seen at a walk-in clinic for a inguinal yeast infection and started on nystatin. Notes this helped resolve the rash. He notes there has been recurrence in his left inguinal area and on his penis. It is improved with use of nystatin though. Fevers. No spreading rash.  PMH: Former smoker   ROS see history of present illness  Objective  Physical Exam Filed Vitals:   11/11/15 1157  BP: 130/74  Pulse: 74  Temp: 98.1 F (36.7 C)    BP Readings from Last 3 Encounters:  11/11/15 130/74  05/20/15 130/68  04/21/15 128/72   Wt Readings from Last 3 Encounters:  11/11/15 210 lb 6.4 oz (95.437 kg)  04/21/15 203 lb 12.8 oz (92.443 kg)  08/27/14 203 lb 12.8 oz (92.443 kg)    Physical Exam  Constitutional: He is well-developed, well-nourished, and in no distress.  Cardiovascular: Normal rate and normal heart sounds.   Pulmonary/Chest: Effort normal and breath sounds normal.  Genitourinary:  Normal testicles, normal scrotum, mild erythema in the left inguinal crease, minimal patchy erythema over penile head and inner aspect  of foreskin with some satellite lesions  Neurological: He is alert. Gait normal.  Skin: Skin is warm and dry. He is not diaphoretic.     Assessment/Plan: Please see individual problem list.  Candidal intertrigo Inguinal rash was consistent with candidal intertrigo. Has improved with use of nystatin. Rash on foreskin and penile head could be candidal in nature with satellite lesions. Could be related to balanitis. Patient will continue nystatin. Does not continue to improve he'll let us know.  Diabetes mellitus type 2, controlled Blood sugars not controlled. Has been back on metformin XR 500 mg daily and tolerating with no change in his stools. We'll check an A1c and a CMP today. We will increase his metformin to 1000 mg daily. He'll continue to monitor his blood sugars. He'll contact us in 1 week if they're not improved. I'll see him back in a month.    Orders Placed This Encounter  Procedures  . HgB A1c  . Comp Met (CMET)    Meds ordered this encounter  Medications  . metFORMIN (GLUCOPHAGE XR) 500 MG 24 hr tablet    Sig: Take 2 tablets (1,000 mg total) by mouth daily with breakfast.    Dispense:  180 tablet    Refill:  1  . nystatin (NYSTATIN) powder    Sig: Apply topically 2 (two) times daily.    Dispense:  15 g    Refill:  0    Tommi Rumps, MD [place]

## 2015-11-11 NOTE — Telephone Encounter (Signed)
This is your 1145 pt. Thanks

## 2015-11-11 NOTE — Telephone Encounter (Signed)
[facility] Patient Name: Austin Bray DOB: Nov 04, 1940 Initial Comment Caller states husband has record of having high BS; past week 180-200, 150-160 a couple of times; no sx; has appt today 11:45; was xferred from office; Nurse Assessment Nurse: Dimas Chyle, RN, Dellis Filbert Date/Time Eilene Ghazi Time): 11/11/2015 9:59:40 AM Confirm and document reason for call. If symptomatic, describe symptoms. You must click the next button to save text entered. ---Caller states husband has record of having high BS; past week 180-200, 150-160 a couple of times; no sx; has appt today 11:45; was transferred from office. BS yesterday was 180 not checked this morning. Currently on metformin 500 mg daily. Has the patient traveled out of the country within the last 30 days? ---No Does the patient have any new or worsening symptoms? ---Yes Will a triage be completed? ---Yes Related visit to physician within the last 2 weeks? ---Yes Does the PT have any chronic conditions? (i.e. diabetes, asthma, etc.) ---Yes List chronic conditions. ---Diabetes type 2, chronic back pain Is this a behavioral health or substance abuse call? ---No Guidelines Guideline Title Affirmed Question Affirmed Notes Diabetes - High Blood Sugar Blood glucose 60-240 mg/dl (3.5 -13 mmol/ l) (all triage questions negative) Final Disposition User [place], RN, Dellis Filbert Disagree/Comply: Comply

## 2015-11-11 NOTE — Assessment & Plan Note (Signed)
Inguinal rash was consistent with candidal intertrigo. Has improved with use of nystatin. Rash on foreskin and penile head could be candidal in nature with satellite lesions. Could be related to balanitis. Patient will continue nystatin. Does not continue to improve he'll let us know.

## 2015-11-11 NOTE — Assessment & Plan Note (Signed)
Blood sugars not controlled. Has been back on metformin XR 500 mg daily and tolerating with no change in his stools. We'll check an A1c and a CMP today. We will increase his metformin to 1000 mg daily. He'll continue to monitor his blood sugars. He'll contact us in 1 week if they're not improved. I'll see him back in a month.

## 2015-11-13 ENCOUNTER — Telehealth: Payer: Self-pay | Admitting: Family Medicine

## 2015-11-13 NOTE — Telephone Encounter (Signed)
Unfortunately the patient will need to be seen in the office prior to this being ordered. We have not discussed that since his first visit in October.

## 2015-11-13 NOTE — Telephone Encounter (Signed)
Planning to see Dr. Tawana Scale at [facility] lumbar pain, (States he spoke with you about this a while ago) Daughter in Sports coach is a Marine scientist at [employer] and got him in to see the doctor, but the doctor wants a MRI prior to the appt.  So he needs a referral for it.  Thanks

## 2015-11-13 NOTE — Telephone Encounter (Signed)
Pt requesting a referral for a MRI for his back. Pt wants to see a specialist in [place]. Specialist office recommanded to have a MRI done before seeing specialist. Please call pt's wife at [phone].

## 2015-11-13 NOTE — Telephone Encounter (Signed)
Scheduled for tomorrow at 3pm.thanks

## 2015-11-14 ENCOUNTER — Encounter: Payer: Self-pay | Admitting: Family Medicine

## 2015-11-14 ENCOUNTER — Ambulatory Visit (INDEPENDENT_AMBULATORY_CARE_PROVIDER_SITE_OTHER): Payer: Medicare Other | Admitting: Family Medicine

## 2015-11-14 ENCOUNTER — Other Ambulatory Visit: Payer: Self-pay | Admitting: Family Medicine

## 2015-11-14 VITALS — BP 132/78 | HR 99 | Temp 98.7°F | Wt 205.8 lb

## 2015-11-14 DIAGNOSIS — M5136 Other intervertebral disc degeneration, lumbar region: Secondary | ICD-10-CM | POA: Diagnosis not present

## 2015-11-14 DIAGNOSIS — M545 Low back pain, unspecified: Secondary | ICD-10-CM

## 2015-11-14 NOTE — Assessment & Plan Note (Signed)
Chronic issue. Mildly worsened recently. No red flags. No neurological symptoms. Patient is supposed to see a neurosurgeon at [facility] and they've requested an MRI. MRI order was placed. Patient's given return precautions.

## 2015-11-14 NOTE — Patient Instructions (Signed)
Nice to see you. I have ordered MRI. You can continue the ibuprofen and heating pad for your discomfort. if you develop numbness, weakness, loss of bowel or bladder function, numbness between her legs, fevers, or any new or changing symptoms please seek medical attention.

## 2015-11-14 NOTE — Progress Notes (Signed)
Pre visit review using our clinic review tool, if applicable. No additional management support is needed unless otherwise documented below in the visit note. 

## 2015-11-14 NOTE — Telephone Encounter (Signed)
Is this ok to refill? Thanks

## 2015-11-14 NOTE — Progress Notes (Signed)
Patient ID: RUNAKO SCHERMAN, male   DOB: [birth year], 75 y.o.   MRN: [id number]  Tommi Rumps, MD Phone: [phone]  BRANDTLEY LINSON is a 75 y.o. male who presents today for same-day visit.  Low back pain: Patient notes this is chronic. His gotten worse recently. All hurts in his low back. No radiation down his legs. No numbness, weakness, loss of bowel or bladder function, saddle anesthesia, or fevers. He does not history of skin cancer though no other cancer history. Previously has had an MRI done that showed DDD at L3-L5 though this was done 10 years ago. Was supposed to have an operation at that time. He has also had epidural injections. Currently is using a heating pad and ibuprofen. He is set up to see a neurosurgeon at [facility] and they've requested an MRI.  ROS see history of present illness  Objective  Physical Exam Filed Vitals:   11/14/15 1506  BP: 132/78  Pulse: 99  Temp: 98.7 F (37.1 C)   Physical Exam  Constitutional: He is well-developed, well-nourished, and in no distress.  HENT:  Head: Normocephalic and atraumatic.  Cardiovascular: Normal rate, regular rhythm and normal heart sounds.   Pulmonary/Chest: Effort normal and breath sounds normal.  Musculoskeletal:  No midline spine tenderness, no midline spine step-off, no muscular back tenderness  Neurological: He is alert. Gait normal.  Stands up easily from a seated position with no help  Skin: Skin is warm and dry. He is not diaphoretic.     Assessment/Plan: Please see individual problem list.  DDD (degenerative disc disease), lumbar Chronic issue. Mildly worsened recently. No red flags. No neurological symptoms. Patient is supposed to see a neurosurgeon at [facility] and they've requested an MRI. MRI order was placed. Patient's given return precautions.    Orders Placed This Encounter  Procedures  . MR Lumbar Spine Wo Contrast    Standing Status: Future     Number of Occurrences:      Standing  Expiration Date: 01/13/2017    Order Specific Question:  Reason for Exam (SYMPTOM  OR DIAGNOSIS REQUIRED)    Answer:  low back pain, chronic, worsening    Order Specific Question:  Preferred imaging location?    Answer:  [facility] (table limit-300lbs)    Order Specific Question:  What is the patient's sedation requirement?    Answer:  No Sedation    Order Specific Question:  Does the patient have a pacemaker or implanted devices?    Answer:  Yes    Order Specific Question:  Manufacturer of pacemake or implanted device?    Answer:  patient has cervical spine fusion, does not have a pacemaker     Tommi Rumps, MD [place]

## 2015-11-20 ENCOUNTER — Ambulatory Visit
Admission: RE | Admit: 2015-11-20 | Discharge: 2015-11-20 | Disposition: A | Discharge status: Home / Self Care | Payer: Medicare Other | Source: Ambulatory Visit | Attending: Family Medicine | Admitting: Family Medicine

## 2015-11-20 DIAGNOSIS — M545 Low back pain, unspecified: Secondary | ICD-10-CM

## 2015-11-20 DIAGNOSIS — M4806 Spinal stenosis, lumbar region: Secondary | ICD-10-CM | POA: Diagnosis not present

## 2015-11-20 DIAGNOSIS — G8929 Other chronic pain: Secondary | ICD-10-CM | POA: Insufficient documentation

## 2015-12-11 ENCOUNTER — Encounter: Payer: Self-pay | Admitting: Family Medicine

## 2015-12-11 ENCOUNTER — Ambulatory Visit (INDEPENDENT_AMBULATORY_CARE_PROVIDER_SITE_OTHER): Payer: Medicare Other | Admitting: Family Medicine

## 2015-12-11 VITALS — BP 130/84 | HR 74 | Temp 98.7°F | Ht 67.87 in | Wt 209.4 lb

## 2015-12-11 DIAGNOSIS — E119 Type 2 diabetes mellitus without complications: Secondary | ICD-10-CM | POA: Diagnosis not present

## 2015-12-11 DIAGNOSIS — B372 Candidiasis of skin and nail: Secondary | ICD-10-CM

## 2015-12-11 DIAGNOSIS — Z981 Arthrodesis status: Secondary | ICD-10-CM

## 2015-12-11 DIAGNOSIS — I10 Essential (primary) hypertension: Secondary | ICD-10-CM

## 2015-12-11 DIAGNOSIS — M542 Cervicalgia: Secondary | ICD-10-CM | POA: Diagnosis not present

## 2015-12-11 DIAGNOSIS — F32A Depression, unspecified: Secondary | ICD-10-CM

## 2015-12-11 DIAGNOSIS — F329 Major depressive disorder, single episode, unspecified: Secondary | ICD-10-CM

## 2015-12-11 MED ORDER — SERTRALINE HCL 100 MG PO TABS: 100.0000 mg | ORAL_TABLET | Freq: Every day | ORAL | Status: DC

## 2015-12-11 MED ORDER — METFORMIN HCL ER 500 MG PO TB24: 1500.0000 mg | ORAL_TABLET | Freq: Every day | ORAL | Status: DC

## 2015-12-11 MED ORDER — NYSTATIN 100000 UNIT/GM EX CREA: 1.0000 "application " | TOPICAL_CREAM | Freq: Two times a day (BID) | CUTANEOUS | Status: DC

## 2015-12-11 NOTE — Progress Notes (Addendum)
Patient ID: Austin Bray, male   DOB: 05-16-1941, 75 y.o.   MRN: [id number]  Austin Rumps, MD Phone: [phone]  Austin Bray is a 75 y.o. male who presents today for follow-up.  DIABETES Disease Monitoring: Blood Sugar ranges-150-260 Polyuria/phagia/dipsia- no     ophthalmology- saw late last year Medications: Compliance- taking metformin 1000 mg daily Hypoglycemic symptoms- rare, gets mildly shaky and eat something and feels improved  HYPERTENSION Disease Monitoring Home BP Monitoring not checking very frequently, reports not ever abnormal Chest pain- no    Dyspnea- no Medications Compliance-  not currently taking medications, notes he ran out of his medication several months ago.  Edema- no  Depression: Patient notes he felt more depressed for the last several months. They have been on a fixed income and own 2 houses. Has not been able to do the things he enjoys. Notes he feels somewhat down and lethargic with this. Does not want to go out and do anything. Is taking Zoloft and trazodone. No SI.  Neck pain: Patient notes 5 days ago had onset of right-sided neck discomfort. Hurts when he moves his neck to the right side. No radiation to the arms. No numbness or weakness. Does have a history of anterior discectomy and fusion.  Patient also reports that his candidal intertrigo is improved though he has a difficult time getting the powder to stick. He is requesting a cream be sent in.  PMH: Former smoker   ROS see history of present illness  Objective  Physical Exam Filed Vitals:   12/11/15 1107  BP: 130/84  Pulse: 74  Temp: 98.7 F (37.1 C)    BP Readings from Last 3 Encounters:  12/11/15 130/84  11/14/15 132/78  11/11/15 130/74   Wt Readings from Last 3 Encounters:  12/11/15 209 lb 6.4 oz (94.983 kg)  11/14/15 205 lb 12.8 oz (93.35 kg)  11/11/15 210 lb 6.4 oz (95.437 kg)    Physical Exam  Constitutional: He is well-developed, well-nourished, and in no  distress.  HENT:  Head: Normocephalic and atraumatic.  Cardiovascular: Normal rate, regular rhythm and normal heart sounds.   Pulmonary/Chest: Effort normal and breath sounds normal.  Musculoskeletal:  No midline spine tenderness, no midline spine step-off, no muscular back or neck tenderness  Neurological: He is alert. Gait normal.  5/5 strength in bilateral biceps, triceps, grip, quads, hamstrings, plantar and dorsiflexion, sensation to light touch intact in bilateral UE and LE, normal gait, 2+ patellar reflexes  Skin: Skin is warm and dry. He is not diaphoretic.  Psychiatric:  Mood depressed, affect mildly depressed     Assessment/Plan: Please see individual problem list.  Candidal intertrigo Improving. Changed to nystatin cream  Depression Somewhat worsened recently. We will increase his Zoloft to 100 mg daily. Continue trazodone. See him back in a month. Given return precautions.  Diabetes mellitus type 2, controlled Sugars still slightly elevated. We will increase his metformin to 1500 mg daily for the next week and then if tolerated to 2000 mg daily. Continue check sugars. Follow-up in one month.  Essential hypertension At goal. Not currently taking medications. Continue to monitor.  History of fusion of cervical spine Patient reports some neck pain over the last week. Has a history of cervical spine fusion. Neurologically intact. We'll obtain an x-ray of his neck to begin with and if anything abnormal or if he has persistent neck pain would consider neurosurgical referral. Given return precautions.    Orders Placed This Encounter  Procedures  .  DG Cervical Spine Complete    Standing Status: Future     Number of Occurrences:      Standing Expiration Date: 02/09/2017    Order Specific Question:  Reason for Exam (SYMPTOM  OR DIAGNOSIS REQUIRED)    Answer:  neck pain, previous fusion    Order Specific Question:  Preferred imaging location?    Answer:  [facility] ordered this encounter  Medications  . metFORMIN (GLUCOPHAGE XR) 500 MG 24 hr tablet    Sig: Take 3 tablets (1,500 mg total) by mouth daily with breakfast.    Dispense:  180 tablet    Refill:  1  . sertraline (ZOLOFT) 100 MG tablet    Sig: Take 1 tablet (100 mg total) by mouth daily. Take one by mouth daily.    Dispense:  90 tablet    Refill:  2  . nystatin cream (MYCOSTATIN)    Sig: Apply 1 application topically 2 (two) times daily.    Dispense:  30 g    Refill:  0    Was brought to my attention during the patient's visit that he was rude to the front office staff and would not sign insurance documentation. It was stated that he has done this previously. I discussed this with the patient and he noted that he would not sign any documentation where he is not signing a physical copy of something and that he was rude due to the fact that we have repeatedly asked him to sign this. I discussed with him that we would try to get a physical copy for him to sign if possible. Also advised that if he continued to be rude to our staff that we will add no longer be able to provide medical care to him. He voiced understanding and then stated that he would call [employer] to see what they thought about this.   Austin Rumps, MD [place]

## 2015-12-11 NOTE — Assessment & Plan Note (Signed)
Somewhat worsened recently. We will increase his Zoloft to 100 mg daily. Continue trazodone. See him back in a month. Given return precautions.

## 2015-12-11 NOTE — Assessment & Plan Note (Addendum)
Patient reports some neck pain over the last week. Has a history of cervical spine fusion. Neurologically intact. We'll obtain an x-ray of his neck to begin with and if anything abnormal or if he has persistent neck pain would consider neurosurgical referral. Given return precautions.

## 2015-12-11 NOTE — Progress Notes (Signed)
Pre visit review using our clinic review tool, if applicable. No additional management support is needed unless otherwise documented below in the visit note. 

## 2015-12-11 NOTE — Patient Instructions (Signed)
Nice to see you. We are going to increase your metformin to 1500 mg once a day. After about a week if you're tolerating this you can increase to 2000 mg once a day. We will increase your sertraline to 100 mg daily. Please go get the x-ray of your neck. If you develop any thoughts of harming herself or others, numbness, weakness, worsening neck pain, or any new or change in symptoms please seek medical attention.

## 2015-12-11 NOTE — Assessment & Plan Note (Signed)
Improving. Changed to nystatin cream

## 2015-12-11 NOTE — Assessment & Plan Note (Signed)
At goal. Not currently taking medications. Continue to monitor.

## 2015-12-11 NOTE — Assessment & Plan Note (Signed)
Sugars still slightly elevated. We will increase his metformin to 1500 mg daily for the next week and then if tolerated to 2000 mg daily. Continue check sugars. Follow-up in one month.

## 2015-12-12 DIAGNOSIS — L905 Scar conditions and fibrosis of skin: Secondary | ICD-10-CM | POA: Diagnosis not present

## 2015-12-12 DIAGNOSIS — D044 Carcinoma in situ of skin of scalp and neck: Secondary | ICD-10-CM | POA: Diagnosis not present

## 2016-02-27 ENCOUNTER — Telehealth: Payer: Self-pay | Admitting: Family Medicine

## 2016-02-27 MED ORDER — TRAZODONE HCL 150 MG PO TABS: 300.0000 mg | ORAL_TABLET | Freq: Every day | ORAL | 1 refills | Status: DC

## 2016-02-27 NOTE — Telephone Encounter (Signed)
medication has been refilled. 

## 2016-02-27 NOTE — Telephone Encounter (Signed)
Pt needs a refill on traZODone (DESYREL) 150 MG tablet sent to [facility] on [address]. Please advise Pt is out and needs asap

## 2016-03-01 ENCOUNTER — Telehealth: Payer: Self-pay | Admitting: Family Medicine

## 2016-03-01 ENCOUNTER — Other Ambulatory Visit: Payer: Self-pay | Admitting: Surgical

## 2016-03-01 MED ORDER — TRAZODONE HCL 150 MG PO TABS: 300.0000 mg | ORAL_TABLET | Freq: Every day | ORAL | 1 refills | Status: DC

## 2016-03-01 NOTE — Telephone Encounter (Signed)
RX sent to the [facility] pharmacy. Left message on patients cell phone letting him know this.

## 2016-03-01 NOTE — Telephone Encounter (Signed)
Austin Bray called saying his medication refill of Trazodone shouldn't have gone to the mail order pharmacy. He's completely out of medication and is unable to sleep. He thought a 30 day supply was going to be sent to [facility] [address] He said he desperately needs this medication sent to his pharmacy today. Please give him a call if needed.  Pt's ph# [phone] Thank you.

## 2016-03-18 DIAGNOSIS — Z08 Encounter for follow-up examination after completed treatment for malignant neoplasm: Secondary | ICD-10-CM | POA: Diagnosis not present

## 2016-03-18 DIAGNOSIS — Z85828 Personal history of other malignant neoplasm of skin: Secondary | ICD-10-CM | POA: Diagnosis not present

## 2016-03-18 DIAGNOSIS — L821 Other seborrheic keratosis: Secondary | ICD-10-CM | POA: Diagnosis not present

## 2016-03-18 DIAGNOSIS — X32XXXA Exposure to sunlight, initial encounter: Secondary | ICD-10-CM | POA: Diagnosis not present

## 2016-03-18 DIAGNOSIS — L570 Actinic keratosis: Secondary | ICD-10-CM | POA: Diagnosis not present

## 2016-03-18 DIAGNOSIS — L218 Other seborrheic dermatitis: Secondary | ICD-10-CM | POA: Diagnosis not present

## 2016-04-14 ENCOUNTER — Telehealth: Payer: Self-pay | Admitting: Family Medicine

## 2016-04-14 MED ORDER — NYSTATIN 100000 UNIT/GM EX CREA: 1.0000 "application " | TOPICAL_CREAM | Freq: Two times a day (BID) | CUTANEOUS | 0 refills | Status: DC

## 2016-04-14 NOTE — Telephone Encounter (Signed)
Pt has another yeast infection and would like Dr. Caryl Bis to prescribe nystatin cream (MYCOSTATIN) again.

## 2016-04-14 NOTE — Telephone Encounter (Signed)
Pt advised of PCP's notes and gave verbal understanding.

## 2016-04-14 NOTE — Telephone Encounter (Signed)
Refill sent to pharmacy. Please advise him that if the rash does not improve he needs to be reevaluated. Thanks.

## 2016-04-14 NOTE — Telephone Encounter (Signed)
Last seen 12/11/15. You will want to see him again before prescribing correct?

## 2016-05-25 ENCOUNTER — Other Ambulatory Visit: Payer: Self-pay | Admitting: Family Medicine

## 2016-06-15 IMAGING — US US ABDOMEN LIMITED
1 series · 14 of 25 positions shown · non-contrast
Comparison: None in PACs necks pineal

CLINICAL DATA: Increased bili Rubin levels in the urine.

EXAM:
US ABDOMEN LIMITED - RIGHT UPPER QUADRANT

[Series 1: us abdomen limited · 0.22mm/px · 14 of 47 slices shown]
[im 1/47]
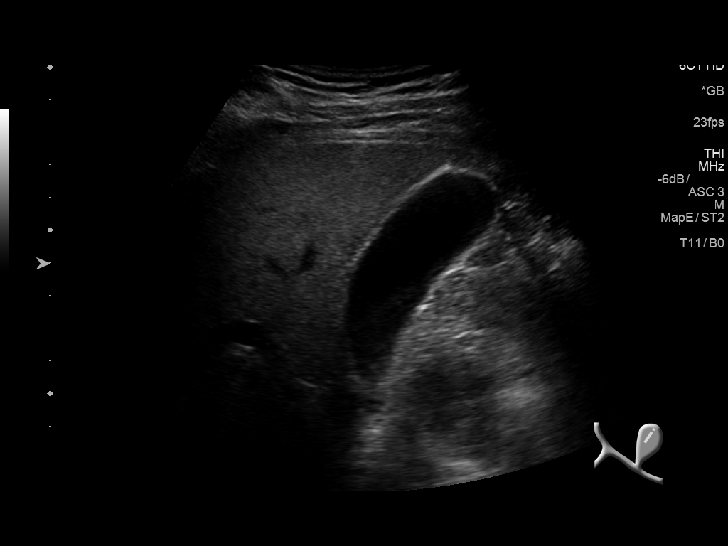
[im 4/47]
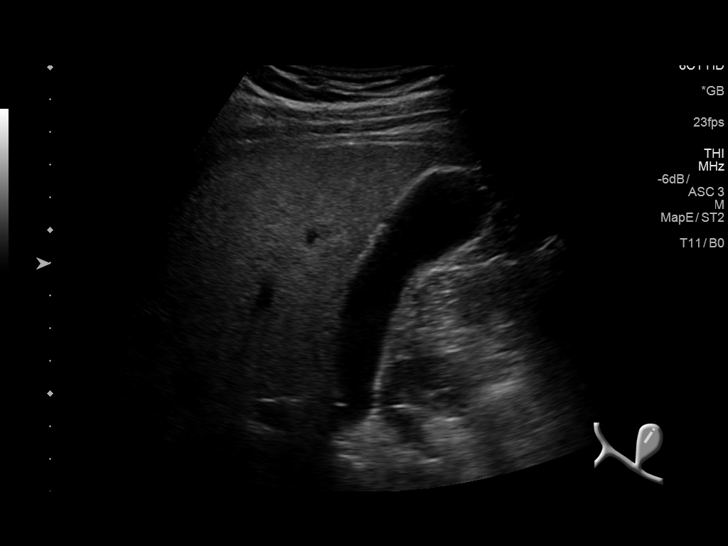
[im 8/47]
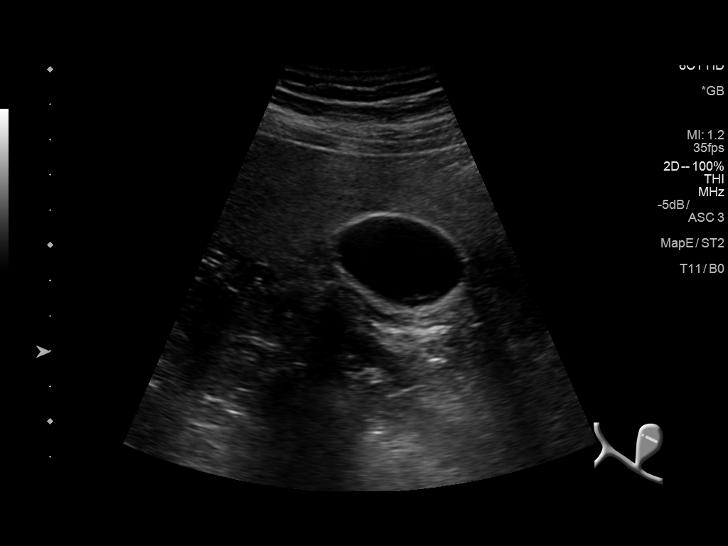
[im 12/47]
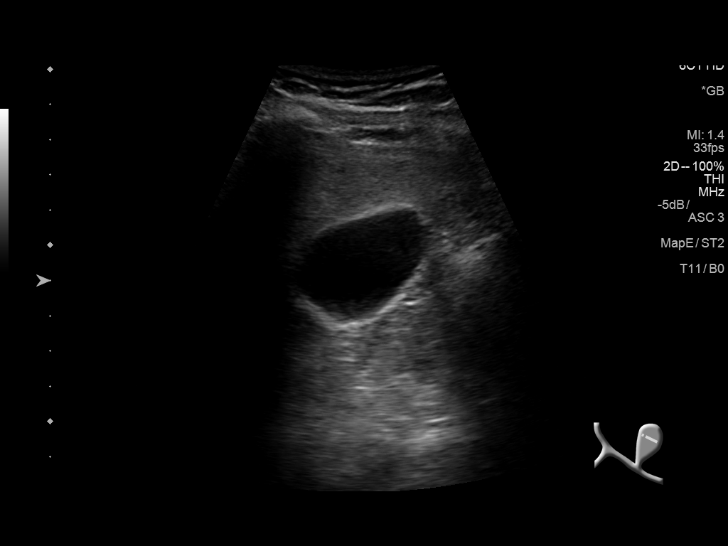
[im 16/47]
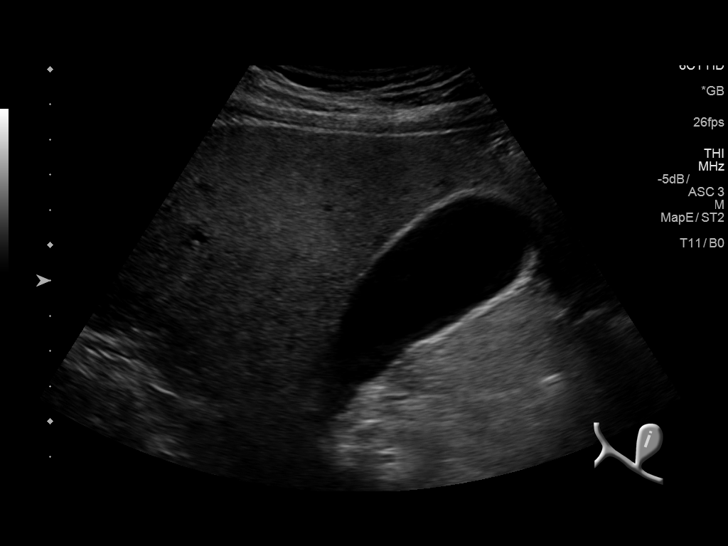
[im 18/47]
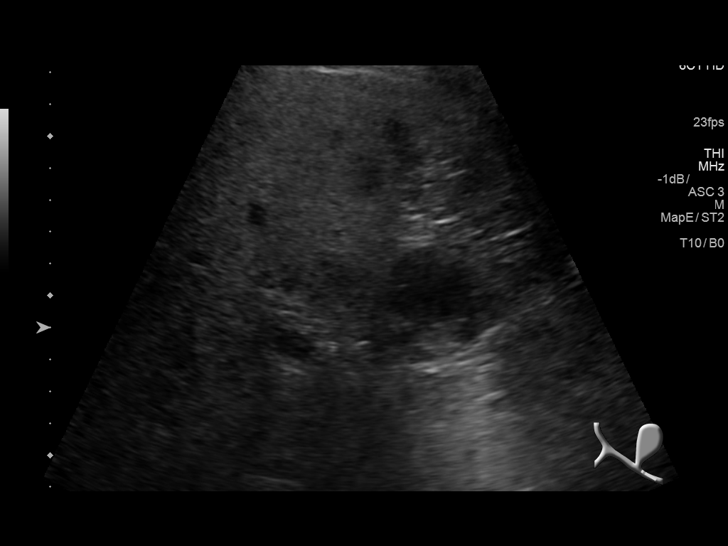
[im 22/47]
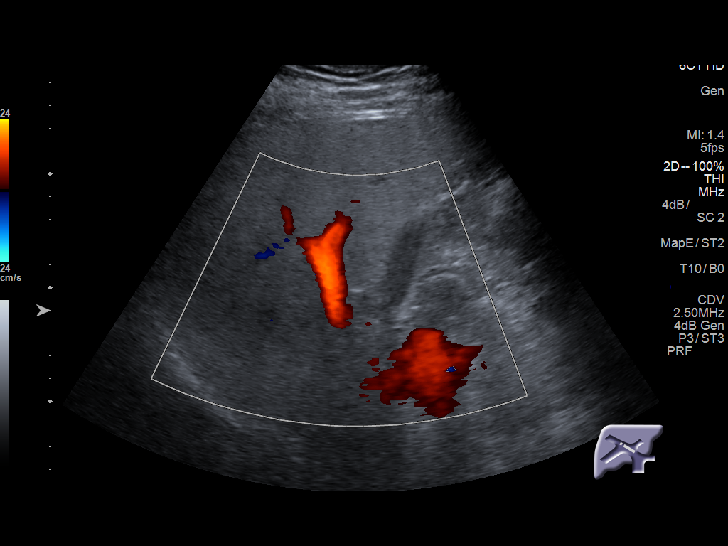
[im 25/47]
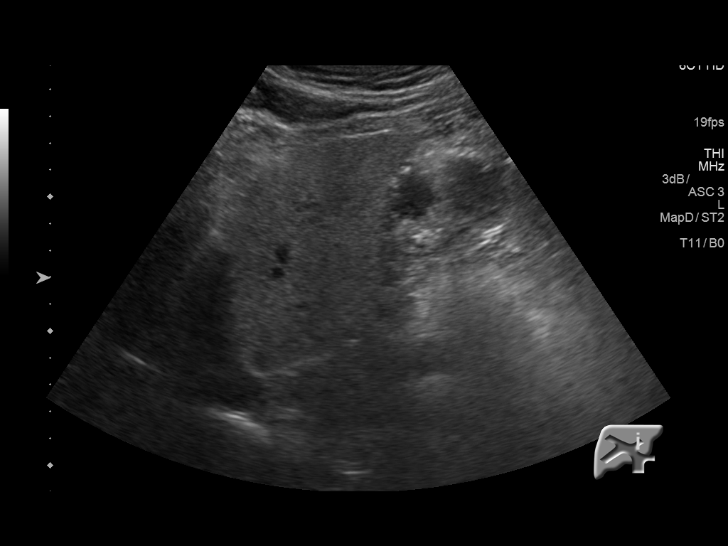
[im 29/47]
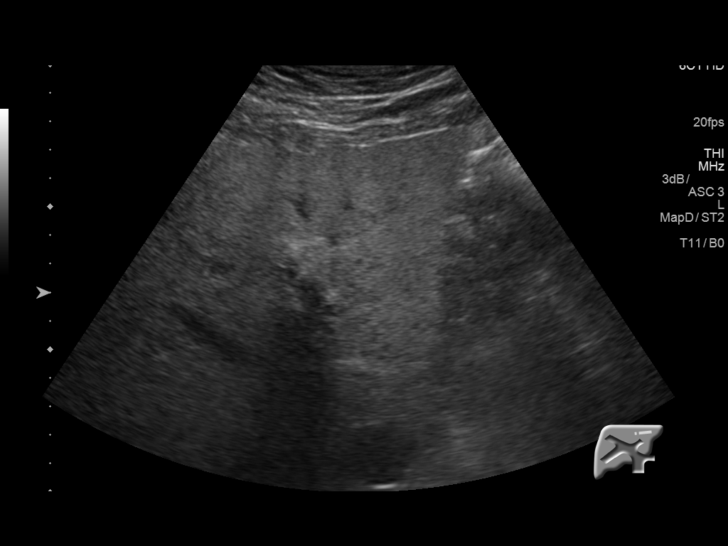
[im 31/47]
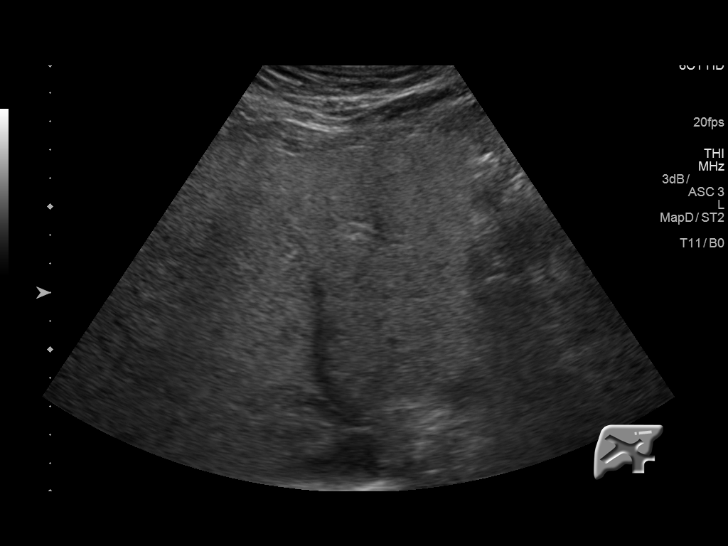
[im 35/47]
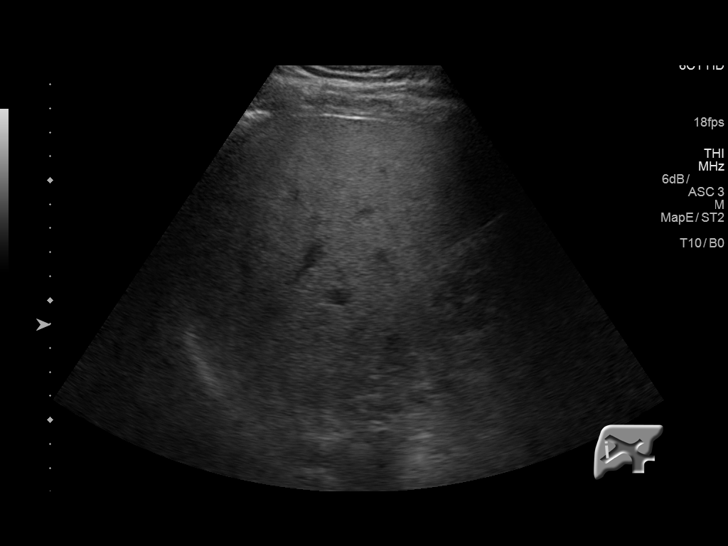
[im 39/47]
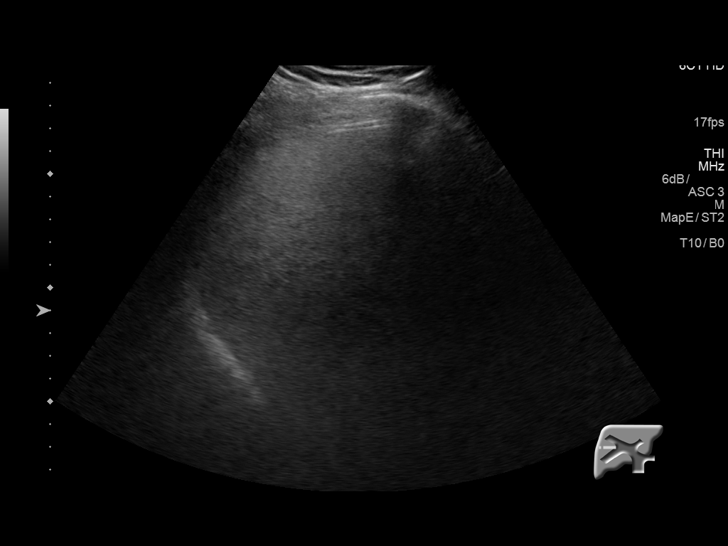
[im 43/47]
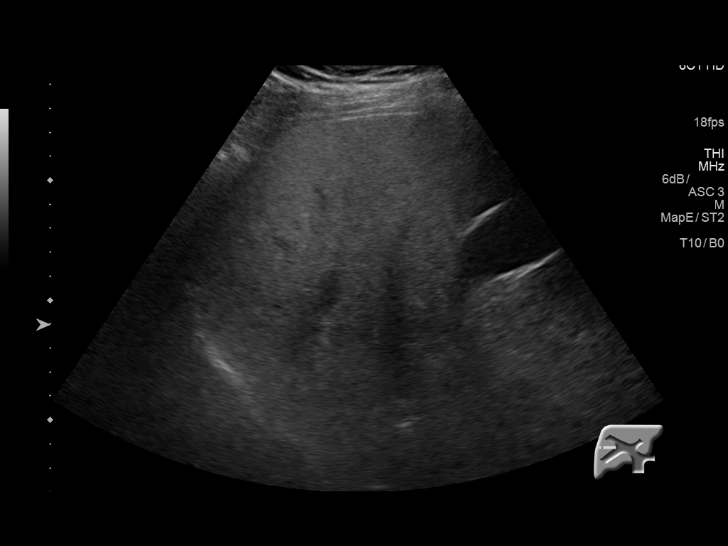
[im 47/47]
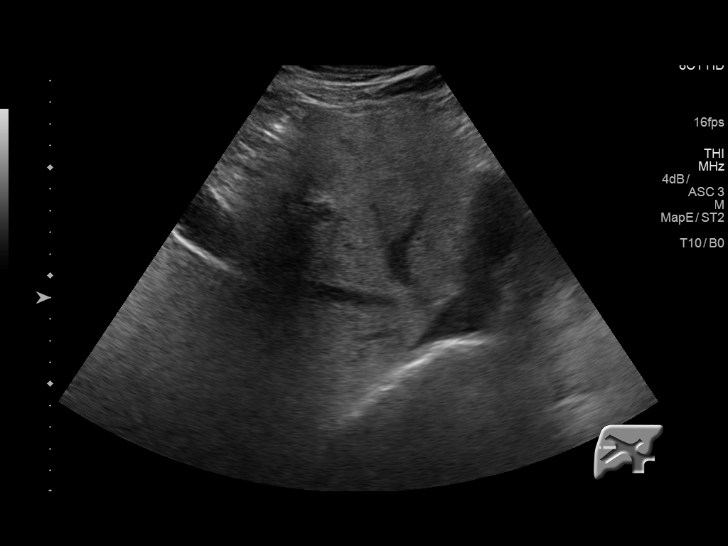

[14 of 25 positions shown; findings below may reference images not displayed]

FINDINGS: Gallbladder:

The gallbladder is adequately distended with no evidence of stones,
wall thickening, or pericholecystic fluid. There is no positive
sonographic Murphy's sign.

Common bile duct:

Diameter: 3.6 mm

Liver:

The hepatic echotexture is mildly increased diffusely. There is no
focal mass or ductal dilation. The surface contour of the liver is
normal.
IMPRESSION: Fatty infiltrative change of the liver. No acute abnormality of the
liver, gallbladder, or common bile duct is observed.

## 2016-08-26 ENCOUNTER — Other Ambulatory Visit: Payer: Self-pay | Admitting: Family Medicine

## 2016-08-26 NOTE — Telephone Encounter (Signed)
Refill sent to pharmacy. Patient needs a follow-up scheduled as he has not been seen in 9 months.

## 2016-08-26 NOTE — Telephone Encounter (Signed)
Last OV 12/11/15 last filled 06/09/15 90 3rf

## 2016-08-27 NOTE — Telephone Encounter (Signed)
Left message to return call 

## 2016-09-01 NOTE — Telephone Encounter (Signed)
Left message to return call 

## 2016-09-06 NOTE — Telephone Encounter (Signed)
Mailed letter to notify

## 2016-10-26 ENCOUNTER — Encounter: Payer: Self-pay | Admitting: Family Medicine

## 2016-10-26 ENCOUNTER — Ambulatory Visit (INDEPENDENT_AMBULATORY_CARE_PROVIDER_SITE_OTHER): Payer: Medicare Other

## 2016-10-26 ENCOUNTER — Ambulatory Visit (INDEPENDENT_AMBULATORY_CARE_PROVIDER_SITE_OTHER): Payer: Medicare Other | Admitting: Family Medicine

## 2016-10-26 VITALS — BP 142/70 | HR 114 | Temp 99.9°F | Wt 198.2 lb

## 2016-10-26 DIAGNOSIS — J181 Lobar pneumonia, unspecified organism: Secondary | ICD-10-CM

## 2016-10-26 DIAGNOSIS — F4321 Adjustment disorder with depressed mood: Secondary | ICD-10-CM

## 2016-10-26 DIAGNOSIS — R0989 Other specified symptoms and signs involving the circulatory and respiratory systems: Secondary | ICD-10-CM | POA: Diagnosis not present

## 2016-10-26 DIAGNOSIS — F4320 Adjustment disorder, unspecified: Secondary | ICD-10-CM

## 2016-10-26 DIAGNOSIS — R059 Cough, unspecified: Secondary | ICD-10-CM

## 2016-10-26 DIAGNOSIS — R05 Cough: Secondary | ICD-10-CM | POA: Diagnosis not present

## 2016-10-26 DIAGNOSIS — J189 Pneumonia, unspecified organism: Secondary | ICD-10-CM | POA: Insufficient documentation

## 2016-10-26 LAB — POCT INFLUENZA A/B
INFLUENZA B, POC: NEGATIVE
Influenza A, POC: NEGATIVE

## 2016-10-26 MED ORDER — AMOXICILLIN 500 MG PO CAPS: 1000.0000 mg | ORAL_CAPSULE | Freq: Three times a day (TID) | ORAL | 0 refills | Status: DC

## 2016-10-26 MED ORDER — BENZONATATE 200 MG PO CAPS: 200.0000 mg | ORAL_CAPSULE | Freq: Two times a day (BID) | ORAL | 0 refills | Status: DC | PRN

## 2016-10-26 MED ORDER — DOXYCYCLINE HYCLATE 100 MG PO TABS: 100.0000 mg | ORAL_TABLET | Freq: Two times a day (BID) | ORAL | 0 refills | Status: DC

## 2016-10-26 NOTE — Assessment & Plan Note (Signed)
Patient's history and exam concerning for pneumonia. Suspect tachycardia related to infection. Oxygenation is normal. He is in no distress. We will obtain a chest x-ray. We'll start on amoxicillin and doxycycline given his risk factor of diabetes. Tessalon for cough. We'll see him back in 2 days for recheck. He is given return precautions.

## 2016-10-26 NOTE — Patient Instructions (Signed)
Nice to see you. We are going to get a chest x-ray. We will start you on amoxicillin and doxycycline to cover for pneumonia. Should take a probiotic or eat yogurt with this. Please contact hospice for grief counseling. If you develop shortness of breath, cough productive of blood, fevers, harming your self, or any new or changing symptoms please seek medical attention medially.  Hospice Phone: [place]: [phone]

## 2016-10-26 NOTE — Assessment & Plan Note (Signed)
Patient was provided with hosp[facility] [place]'s phone number to contact for grief counseling.

## 2016-10-26 NOTE — Progress Notes (Signed)
Pre visit review using our clinic review tool, if applicable. No additional management support is needed unless otherwise documented below in the visit note. 

## 2016-10-26 NOTE — Progress Notes (Signed)
Austin Rumps, MD Phone: [phone]  Austin Bray is a 76 y.o. male who presents today for same-day visit.  Patient notes over the last month he's had cough and chest congestion. Notes this started with a tickle though has worsened. Notes significant congestion in his chest. No fevers though has had some sweats. Is coughing up clear mucus. Some minimal shortness of breath. No chest pain or wheezing. Some muscle soreness in his abdomen with his coughing. Does have a history of pneumonia and bronchitis.  Grief: Patient's wife died of cancer recently. He did well initially though has had issues with grief since then. He feels lonely and just feels like he has no one to do anything with. He notes no depression. No SI.  PMH: Former smoker   ROS see history of present illness  Objective  Physical Exam Vitals:   10/26/16 1322  BP: (!) 142/70  Pulse: (!) 114  Temp: 99.9 F (37.7 C)    BP Readings from Last 3 Encounters:  10/26/16 (!) 142/70  12/11/15 130/84  11/14/15 132/78   Wt Readings from Last 3 Encounters:  10/26/16 198 lb 3.2 oz (89.9 kg)  12/11/15 209 lb 6.4 oz (95 kg)  11/14/15 205 lb 12.8 oz (93.4 kg)    Physical Exam  Constitutional: No distress.  HENT:  Head: Normocephalic and atraumatic.  Mouth/Throat: Oropharynx is clear and moist. No oropharyngeal exudate.  Normal TMs  Eyes: Conjunctivae are normal. Pupils are equal, round, and reactive to light.  Neck: Neck supple.  Cardiovascular: Regular rhythm and normal heart sounds.  Tachycardia present.   Pulmonary/Chest: Effort normal. No respiratory distress. He has no wheezes.  Right lower lung field crackles noted, otherwise clear  Musculoskeletal: He exhibits no edema.  Lymphadenopathy:    He has no cervical adenopathy.  Neurological: He is alert.  Skin: He is not diaphoretic.     Assessment/Plan: Please see individual problem list.  Community acquired pneumonia Patient's history and exam  concerning for pneumonia. Suspect tachycardia related to infection. Oxygenation is normal. He is in no distress. We will obtain a chest x-ray. We'll start on amoxicillin and doxycycline given his risk factor of diabetes. Tessalon for cough. We'll see him back in 2 days for recheck. He is given return precautions.  Grief reaction Patient was provided with hospice of [place]'s phone number to contact for grief counseling.   Orders Placed This Encounter  Procedures  . DG Chest 2 View    Standing Status:   Future    Number of Occurrences:   1    Standing Expiration Date:   12/26/2017    Order Specific Question:   Reason for Exam (SYMPTOM  OR DIAGNOSIS REQUIRED)    Answer:   focal right lower lung field crackles, cough for one month    Order Specific Question:   Preferred imaging location?    Answer:   [facility] Specific Question:   Radiology Contrast Protocol - do NOT remove file path    Answer:   [email or web address].pdf  . POCT Influenza A/B    Meds ordered this encounter  Medications  . amoxicillin (AMOXIL) 500 MG capsule    Sig: Take 2 capsules (1,000 mg total) by mouth 3 (three) times daily.    Dispense:  30 capsule    Refill:  0  . doxycycline (VIBRA-TABS) 100 MG tablet    Sig: Take 1 tablet (100 mg total) by mouth 2 (two) times daily.  Dispense:  10 tablet    Refill:  0  . benzonatate (TESSALON) 200 MG capsule    Sig: Take 1 capsule (200 mg total) by mouth 2 (two) times daily as needed for cough.    Dispense:  20 capsule    Refill:  0  Encouraged to take a probiotic or eat yogurt with the antibiotics.  Austin Rumps, MD [place]

## 2016-10-29 ENCOUNTER — Encounter: Payer: Self-pay | Admitting: Family Medicine

## 2016-10-29 ENCOUNTER — Ambulatory Visit (INDEPENDENT_AMBULATORY_CARE_PROVIDER_SITE_OTHER): Payer: Medicare Other | Admitting: Family Medicine

## 2016-10-29 DIAGNOSIS — E119 Type 2 diabetes mellitus without complications: Secondary | ICD-10-CM

## 2016-10-29 DIAGNOSIS — J189 Pneumonia, unspecified organism: Secondary | ICD-10-CM

## 2016-10-29 DIAGNOSIS — J181 Lobar pneumonia, unspecified organism: Secondary | ICD-10-CM

## 2016-10-29 NOTE — Assessment & Plan Note (Signed)
Encouraged him to start checking his blood sugars again. We'll have him return in a month for follow-up of his chronic medical issues and obtain lab work at that time.

## 2016-10-29 NOTE — Progress Notes (Signed)
Pre visit review using our clinic review tool, if applicable. No additional management support is needed unless otherwise documented below in the visit note. 

## 2016-10-29 NOTE — Patient Instructions (Signed)
Nice to see you. I am glad you are starting to feel better. Please finish the course of antibiotics. If you have worsening symptoms after coming off the antibiotics please let us know. If you develop shortness of breath, cough productive of blood, fevers, or any new or changing symptoms please seek medical attention immediately.

## 2016-10-29 NOTE — Progress Notes (Signed)
  Tommi Rumps, MD Phone: [phone]  Austin Bray is a 76 y.o. male who presents today for follow-up.  Patient was seen 3 days ago and diagnosed with pneumonia based on his clinical picture. X-ray revealed bronchitic changes. He's been on amoxicillin and doxycycline. He notes his cough is less frequent and less severe. Notes his congestion is better as well. No fevers, sweats, shortness breath, or wheezing. Minimally productive cough. He's not had any diarrhea with the antibiotics. He has not been checking his blood sugars. He has been recently starting back on his metformin. He was not taking many of his medications while he was dealing with his wife's recent illness and death. Not had any urinary issues.  ROS see history of present illness  Objective  Physical Exam Vitals:   10/29/16 1031 10/29/16 1046  BP: (!) 160/88 (!) 148/80  Pulse: 72   Temp: 98.9 F (37.2 C)     BP Readings from Last 3 Encounters:  10/29/16 (!) 148/80  10/26/16 (!) 142/70  12/11/15 130/84   Wt Readings from Last 3 Encounters:  10/29/16 198 lb (89.8 kg)  10/26/16 198 lb 3.2 oz (89.9 kg)  12/11/15 209 lb 6.4 oz (95 kg)    Physical Exam  Constitutional: No distress.  HENT:  Head: Normocephalic and atraumatic.  Mouth/Throat: Oropharynx is clear and moist. No oropharyngeal exudate.  Eyes: Conjunctivae are normal. Pupils are equal, round, and reactive to light.  Cardiovascular: Normal rate, regular rhythm and normal heart sounds.   Pulmonary/Chest: Effort normal and breath sounds normal.  Neurological: He is alert. Gait normal.  Skin: Skin is warm and dry. He is not diaphoretic.     Assessment/Plan: Please see individual problem list.  Community acquired pneumonia Improving. He'll finish his course of antibiotics. Advised if he worsens after coming off of them he should contact us. Return precautions in AVS.  Diabetes mellitus type 2, controlled Encouraged him to start checking his blood  sugars again. We'll have him return in a month for follow-up of his chronic medical issues and obtain lab work at that time.   Tommi Rumps, MD [place]

## 2016-10-29 NOTE — Assessment & Plan Note (Signed)
Improving. He'll finish his course of antibiotics. Advised if he worsens after coming off of them he should contact us. Return precautions in AVS.

## 2016-11-29 ENCOUNTER — Ambulatory Visit: Payer: Medicare Other | Admitting: Family Medicine

## 2016-11-29 DIAGNOSIS — Z0289 Encounter for other administrative examinations: Secondary | ICD-10-CM

## 2016-12-21 ENCOUNTER — Ambulatory Visit (INDEPENDENT_AMBULATORY_CARE_PROVIDER_SITE_OTHER): Payer: Medicare Other

## 2016-12-21 ENCOUNTER — Encounter: Payer: Self-pay | Admitting: Family Medicine

## 2016-12-21 ENCOUNTER — Ambulatory Visit (INDEPENDENT_AMBULATORY_CARE_PROVIDER_SITE_OTHER): Payer: Medicare Other | Admitting: Family Medicine

## 2016-12-21 ENCOUNTER — Ambulatory Visit: Payer: Medicare Other | Admitting: Family Medicine

## 2016-12-21 VITALS — BP 138/62 | HR 78 | Temp 98.3°F | Resp 14 | Ht 68.0 in | Wt 197.8 lb

## 2016-12-21 VITALS — BP 138/62 | HR 78 | Temp 98.3°F | Ht 68.0 in | Wt 197.8 lb

## 2016-12-21 DIAGNOSIS — F4320 Adjustment disorder, unspecified: Secondary | ICD-10-CM | POA: Diagnosis not present

## 2016-12-21 DIAGNOSIS — E119 Type 2 diabetes mellitus without complications: Secondary | ICD-10-CM | POA: Diagnosis not present

## 2016-12-21 DIAGNOSIS — Z0000 Encounter for general adult medical examination without abnormal findings: Secondary | ICD-10-CM

## 2016-12-21 DIAGNOSIS — F4321 Adjustment disorder with depressed mood: Secondary | ICD-10-CM

## 2016-12-21 MED ORDER — METFORMIN HCL ER 500 MG PO TB24: ORAL_TABLET | ORAL | 0 refills | Status: DC

## 2016-12-21 MED ORDER — TRAZODONE HCL 50 MG PO TABS: 50.0000 mg | ORAL_TABLET | Freq: Every day | ORAL | 0 refills | Status: DC

## 2016-12-21 MED ORDER — SERTRALINE HCL 100 MG PO TABS: 50.0000 mg | ORAL_TABLET | Freq: Every day | ORAL | 0 refills | Status: DC

## 2016-12-21 NOTE — Assessment & Plan Note (Signed)
We will restart on metformin. We will have the patient return for fasting lab work.

## 2016-12-21 NOTE — Patient Instructions (Signed)
Nice to see you. We are going to restart you on Zoloft and trazodone. Trazodone will be for sleep at night. We are starting out at the starting dose and can work our way back up. We'll start you back on metformin as well. If you develop thoughts of harming your self or others please seek medical attention medially.

## 2016-12-21 NOTE — Progress Notes (Signed)
Subjective:   Austin Bray is a 76 y.o. male who presents for an Initial Medicare Annual Wellness Visit.  Review of Systems  No ROS.  Medicare Wellness Visit. Cardiac Risk Factors include: advanced age (>71men, >43 women);diabetes mellitus;obesity (BMI >30kg/m2);male gender;hypertension    Objective:    Today's Vitals   12/21/16 0918  BP: 138/62  Pulse: 78  Resp: 14  Temp: 98.3 F (36.8 C)  TempSrc: Oral  SpO2: 98%  Weight: 197 lb 12.8 oz (89.7 kg)  Height: 5\' 8"  (1.727 m)   Body mass index is 30.08 kg/m.  Current Medications (verified) Outpatient Encounter Prescriptions as of 12/21/2016  Medication Sig  . benzonatate (TESSALON) 200 MG capsule Take 1 capsule (200 mg total) by mouth 2 (two) times daily as needed for cough.  . chlorthalidone (HYGROTON) 25 MG tablet Take one tablet by mouth daily.  Marland Kitchen doxycycline (VIBRA-TABS) 100 MG tablet Take 1 tablet (100 mg total) by mouth 2 (two) times daily.  Marland Kitchen etodolac (LODINE) 400 MG tablet Take 1 tablet (400 mg total) by mouth 2 (two) times daily. Take two by mouth daily.  Marland Kitchen glucose blood test strip Use as instructed to check Blood glucose up to three times a day. Dispense contour plus strips.E11.9  . JUBLIA 10 % SOLN   . NYAMYC powder APPLY TOPICALLY TWICE A DAY  . nystatin cream (MYCOSTATIN) Apply 1 application topically 2 (two) times daily.  Marland Kitchen omeprazole (PRILOSEC) 20 MG capsule TAKE 1 CAPSULE DAILY  . sertraline (ZOLOFT) 100 MG tablet Take 1 tablet (100 mg total) by mouth daily. Take one by mouth daily.  . simvastatin (ZOCOR) 40 MG tablet Take 1 tablet (40 mg total) by mouth daily at 6 PM. Take one by mouth daily.  . traZODone (DESYREL) 150 MG tablet Take 2 tablets (300 mg total) by mouth at bedtime. Take two by mouth at night.  . [DISCONTINUED] amoxicillin (AMOXIL) 500 MG capsule Take 2 capsules (1,000 mg total) by mouth 3 (three) times daily.  . metFORMIN (GLUCOPHAGE XR) 500 MG 24 hr tablet Take 3 tablets (1,500 mg total) by  mouth daily with breakfast. (Patient not taking: Reported on 12/21/2016)   No facility-administered encounter medications on file as of 12/21/2016.     Allergies (verified) Patient has no known allergies.   History: Past Medical History:  Diagnosis Date  . Chronic kidney disease    stones 30 years ago  . Depression   . Diabetes mellitus without complication ([place])   . GERD (gastroesophageal reflux disease)   . Hyperlipidemia   . Hypertension    Past Surgical History:  Procedure Laterality Date  . ANTERIOR CERVICAL DISCECTOMY  2015  . TONSILLECTOMY     Family History  Problem Relation Age of Onset  . Dementia Mother   . Hypertension Father   . Hyperlipidemia Father   . Cancer Maternal Grandmother   . Dementia Maternal Grandfather   . Cancer Paternal Grandmother        colon  . Heart disease Paternal Grandfather    Social History   Occupational History  . Not on file.   Social History Main Topics  . Smoking status: Former Research scientist (life sciences)  . Smokeless tobacco: Never Used  . Alcohol use 4.2 oz/week    7 Shots of liquor per week     Comment: weekly  . Drug use: No  . Sexual activity: No   Tobacco Counseling Counseling given: Not Answered   Activities of Daily Living In your present state  of health, do you have any difficulty performing the following activities: 12/21/2016  Hearing? N  Vision? N  Difficulty concentrating or making decisions? N  Walking or climbing stairs? Y  Dressing or bathing? N  Doing errands, shopping? N  Preparing Food and eating ? N  Using the Toilet? N  In the past six months, have you accidently leaked urine? N  Do you have problems with loss of bowel control? N  Managing your Medications? N  Managing your Finances? N  Housekeeping or managing your Housekeeping? N  Some recent data might be hidden    Immunizations and Health Maintenance Immunization History  Administered Date(s) Administered  . Hep A / Hep B 05/27/2015  . Influenza Whole  04/24/2009  . Influenza,inj,Quad PF,36+ Mos 05/17/2014, 05/27/2015   Health Maintenance Due  Topic Date Due  . TETANUS/TDAP  07/31/1959  . PNA vac Low Risk Adult (1 of 2 - PCV13) 07/30/2005  . URINE MICROALBUMIN  02/27/2011  . FOOT EXAM  08/29/2015  . HEMOGLOBIN A1C  05/12/2016  . OPHTHALMOLOGY EXAM  05/19/2016    Patient Care Team: Leone Haven, MD as PCP - General (Family Medicine)  Indicate any recent Medical Services you may have received from other than [facility] providers in the past year (date may be approximate).    Assessment:   This is a routine wellness examination for Austin Bray. The goal of the wellness visit is to assist the patient how to close the gaps in care and create a preventative care plan for the patient.   Osteoporosis risk reviewed.  Medications reviewed; taking without issues or barriers.  Safety issues reviewed; currently lives with son.  Smoke detectors in the home. No firearms in the home. Wears seatbelts when driving or riding with others. Patient does wear sunscreen or protective clothing when in direct sunlight. No violence in the home.  Patient is alert, normal appearance, oriented to person/place/and time. Correctly identified the president of the [place], recall of 3/3 words, and performing simple calculations.  Patient displays appropriate judgement and can read correct time from watch face.  No new identified risk were noted.  No failures at ADL's or IADL's.   BMI- discussed the importance of a healthy diet, water intake and exercise. Educational material provided.   HTN- followed by PCP.  Dental- every six months.  Sleep patterns- Sleeps 6 hours at night.  Wakes feeling tired.   PNA vaccine deferred per patient preference.  He believes he has had this vaccine at another facility.  He plans to follow up previous record.    Patient Concerns: Change in PHQ-2/9. He believes it is due to the recent death of his wife.  Follow up scheduled  with PCP post AWV.    Hearing/Vision screen Hearing Screening Comments: Patient is able to hear conversational tones without difficulty.  No issues reported.   Vision Screening Comments: Followed by [facility] Wears corrective lenses Last OV 2017 Visual acuity not assessed per patient preference since they have regular follow up with the ophthalmologist  Dietary issues and exercise activities discussed: Current Exercise Habits: The patient does not participate in regular exercise at present  Goals    . Healthy Lifestyle          Stay hydrated Stay active Low carb foods      Depression Screen PHQ 2/9 Scores 12/21/2016 10/29/2016 12/11/2015 04/21/2015  PHQ - 2 Score 3 0 2 0  PHQ- 9 Score 9 - 5 -  Fall Risk Fall Risk  12/21/2016 10/29/2016 04/21/2015  Falls in the past year? No No No    Cognitive Function: MMSE - Mini Mental State Exam 12/21/2016  Orientation to time 5  Orientation to Place 5  Registration 3  Attention/ Calculation 5  Recall 3  Language- name 2 objects 2  Language- repeat 1  Language- follow 3 step command 3  Language- read & follow direction 1  Write a sentence 1  Copy design 1  Total score 30        Screening Tests Health Maintenance  Topic Date Due  . TETANUS/TDAP  07/31/1959  . PNA vac Low Risk Adult (1 of 2 - PCV13) 07/30/2005  . URINE MICROALBUMIN  02/27/2011  . FOOT EXAM  08/29/2015  . HEMOGLOBIN A1C  05/12/2016  . OPHTHALMOLOGY EXAM  05/19/2016  . INFLUENZA VACCINE  02/16/2017        Plan:   End of life planning; Advanced aging; Advanced directives discussed.  No HCPOA/Living Will.  Additional information provided to help them start the conversation with family.  Copy of HCPOA/Living Will requested upon completion. Time spent on this topic is 17 minutes.  I have personally reviewed and noted the following in the patient's chart:   . Medical and social history . Use of alcohol, tobacco or illicit drugs  . Current medications  and supplements . Functional ability and status . Nutritional status . Physical activity . Advanced directives . List of other physicians . Hospitalizations, surgeries, and ER visits in previous 12 months . Vitals . Screenings to include cognitive, depression, and falls . Referrals and appointments  In addition, I have reviewed and discussed with patient certain preventive protocols, quality metrics, and best practice recommendations. A written personalized care plan for preventive services as well as general preventive health recommendations were provided to patient.     Varney Biles, LPN   08/26/3149

## 2016-12-21 NOTE — Progress Notes (Signed)
  Tommi Rumps, MD Phone: [phone]  Austin Bray is a 76 y.o. male who presents today for follow-up.  Depression: Patient notes today's been worse than recently. He didn't sleep very well last night. He's been off of Zoloft and trazodone. No anxiety. SI or HI. Has had some difficulty dealing with the loss of his wife.  Diabetes: Hasn't been on metformin since December. Does note some polyuria and polydipsia. Most recent A1c was 8.0.  ROS see history of present illness  Objective  Physical Exam Vitals:   12/21/16 0948  BP: 138/62  Pulse: 78  Temp: 98.3 F (36.8 C)    BP Readings from Last 3 Encounters:  12/21/16 138/62  12/21/16 138/62  10/29/16 (!) 148/80   Wt Readings from Last 3 Encounters:  12/21/16 197 lb 12.8 oz (89.7 kg)  12/21/16 197 lb 12.8 oz (89.7 kg)  10/29/16 198 lb (89.8 kg)    Physical Exam  Constitutional: No distress.  Cardiovascular: Normal rate, regular rhythm and normal heart sounds.   Pulmonary/Chest: Effort normal and breath sounds normal.  Neurological: He is alert. Gait normal.  Skin: Skin is warm and dry. He is not diaphoretic.  Psychiatric:  Mood depressed, affect depressed and intermittently tearful     Assessment/Plan: Please see individual problem list.  Grief reaction Patient continues to deal with grief though also with some depression. We will restart on Zoloft. Was taking trazodone for sleep and this is very beneficial. We'll restart this as well. He'll follow-up as advised.  Diabetes mellitus type 2, controlled We will restart on metformin. We will have the patient return for fasting lab work.   No orders of the defined types were placed in this encounter.   Meds ordered this encounter  Medications  . metFORMIN (GLUCOPHAGE XR) 500 MG 24 hr tablet    Sig: Take 500 mg (one tablet) by mouth daily for 7 days, then increase to 1000 mg (2 tablets) by mouth daily    Dispense:  60 tablet    Refill:  0  . sertraline  (ZOLOFT) 100 MG tablet    Sig: Take 0.5 tablets (50 mg total) by mouth daily.    Dispense:  30 tablet    Refill:  0  . traZODone (DESYREL) 50 MG tablet    Sig: Take 1 tablet (50 mg total) by mouth at bedtime.    Dispense:  30 tablet    Refill:  0   Tommi Rumps, MD [place]

## 2016-12-21 NOTE — Patient Instructions (Addendum)
  Austin Bray , Thank you for taking time to come for your Medicare Wellness Visit. I appreciate your ongoing commitment to your health goals. Please review the following plan we discussed and let me know if I can assist you in the future.   Follow up with Dr. Caryl Bis as needed.    Bring a copy of your [place] and/or Living Will to be scanned into chart once completed.  Have a great day!  These are the goals we discussed: Goals    . Healthy Lifestyle          Stay hydrated Stay active Low carb foods       This is a list of the screening recommended for you and due dates:  Health Maintenance  Topic Date Due  . Tetanus Vaccine  07/31/1959  . Pneumonia vaccines (1 of 2 - PCV13) 07/30/2005  . Urine Protein Check  02/27/2011  . Complete foot exam   08/29/2015  . Hemoglobin A1C  05/12/2016  . Eye exam for diabetics  05/19/2016  . Flu Shot  02/16/2017

## 2016-12-21 NOTE — Assessment & Plan Note (Signed)
Patient continues to deal with grief though also with some depression. We will restart on Zoloft. Was taking trazodone for sleep and this is very beneficial. We'll restart this as well. He'll follow-up as advised.

## 2016-12-21 NOTE — Progress Notes (Signed)
scheduled

## 2016-12-22 ENCOUNTER — Other Ambulatory Visit (INDEPENDENT_AMBULATORY_CARE_PROVIDER_SITE_OTHER): Payer: Medicare Other

## 2016-12-22 DIAGNOSIS — E119 Type 2 diabetes mellitus without complications: Secondary | ICD-10-CM

## 2016-12-22 LAB — COMPREHENSIVE METABOLIC PANEL
ALT: 20 U/L (ref 0–53)
AST: 20 U/L (ref 0–37)
Albumin: 4.4 g/dL (ref 3.5–5.2)
Alkaline Phosphatase: 54 U/L (ref 39–117)
BUN: 20 mg/dL (ref 6–23)
CO2: 26 mEq/L (ref 19–32)
Calcium: 9.7 mg/dL (ref 8.4–10.5)
Chloride: 104 mEq/L (ref 96–112)
Creatinine, Ser: 1.13 mg/dL (ref 0.40–1.50)
GFR: 66.99 mL/min (ref >60.00)
Glucose, Bld: 173 mg/dL — ABNORMAL HIGH (ref 70–99)
Potassium: 3.9 mEq/L (ref 3.5–5.1)
Sodium: 138 mEq/L (ref 135–145)
Total Bilirubin: 0.6 mg/dL (ref 0.2–1.2)
Total Protein: 7.3 g/dL (ref 6.0–8.3)

## 2016-12-22 LAB — HEMOGLOBIN A1C: Hgb A1c MFr Bld: 7.4 % — ABNORMAL HIGH (ref 4.6–6.5)

## 2016-12-22 LAB — LIPID PANEL
Cholesterol: 163 mg/dL (ref 0–200)
HDL: 37.5 mg/dL — ABNORMAL LOW (ref >39.00)
LDL CALC: 105 mg/dL — AB (ref 0–99)
NONHDL: 125.81
Total CHOL/HDL Ratio: 4
Triglycerides: 104 mg/dL (ref 0.0–149.0)
VLDL: 20.8 mg/dL (ref 0.0–40.0)

## 2016-12-22 NOTE — Progress Notes (Signed)
I have reviewed the above note and agree.  Jaquil Todt, M.D.  

## 2016-12-24 ENCOUNTER — Other Ambulatory Visit: Payer: Self-pay | Admitting: Family Medicine

## 2016-12-24 MED ORDER — NYSTATIN 100000 UNIT/GM EX CREA: 1.0000 "application " | TOPICAL_CREAM | Freq: Two times a day (BID) | CUTANEOUS | 0 refills | Status: DC

## 2017-01-18 ENCOUNTER — Other Ambulatory Visit: Payer: Self-pay | Admitting: Family Medicine

## 2017-01-18 NOTE — Telephone Encounter (Signed)
Last OV 12/21/16 ok to fill trazodone?

## 2017-03-04 ENCOUNTER — Other Ambulatory Visit: Payer: Self-pay | Admitting: Family Medicine

## 2017-03-21 ENCOUNTER — Other Ambulatory Visit: Payer: Self-pay | Admitting: Family Medicine

## 2017-06-06 ENCOUNTER — Ambulatory Visit: Payer: Self-pay | Admitting: *Deleted

## 2017-06-06 NOTE — Telephone Encounter (Signed)
Pt  Has   appt   With  Dr  Derrel Nip     On   Wednesday    Pt   Advised  To Lots   Water    Advised  To   Use   Sitz  Baths   Be  [facility]    Tissue    Use  otc    hemmoriods  Do  Not  Strain  When  He  Has   A   bm         Reason for Disposition . [1] Home treatment > 3 days for rectal pain AND [2] not improved  Answer Assessment - Initial Assessment Questions 1. SYMPTOM:  "What's the main symptom you're concerned about?" (e.g., pain, itching, swelling, rash)    Pain    Itches   Bright  Red  Blood    When he   Wipes    2. ONSET: "When did the ________  start?"      Symptoms   3  Weeks    Pt  Also  Has   intermittant  Watery  Stools   No  Constipation    3. RECTAL PAIN: "Do you have any pain around your rectum?" "How bad is the pain?"  (Scale 1-10; or mild, moderate, severe)  - MILD (1-3): doesn't interfere with normal activities   - MODERATE (4-7): interferes with normal activities or awakens from sleep, limping   - SEVERE (8-10): excruciating pain, unable to have a bowel movement      8   Throbs   At  Times    4. RECTAL ITCHING: "Do you have any itching in this area?" "How bad is the itching?"  (Scale 1-10; or mild, moderate, severe)  - MILD - doesn't interfere with normal activities   - MODERATE-SEVERE: interferes with normal activities or awakens from sleep     No  5. CONSTIPATION: "Do you have constipation?" If so, "How bad is it?"     No 6. CAUSE: "What do you think is causing the anus symptoms?"      Not  Sure   Diarrhea   aggrevates  It   7. OTHER SYMPTOMS: "Do you have any other symptoms?"  (e.g., rectal bleeding, abdominal pain, vomiting, fever)       Gas   8. PREGNANCY: "Is there any chance you are pregnant?" "When was your last menstrual period?"      No  Protocols used: RECTAL [facility]

## 2017-06-08 ENCOUNTER — Encounter: Payer: Self-pay | Admitting: Internal Medicine

## 2017-06-08 ENCOUNTER — Ambulatory Visit (INDEPENDENT_AMBULATORY_CARE_PROVIDER_SITE_OTHER): Payer: BLUE CROSS/BLUE SHIELD | Admitting: Internal Medicine

## 2017-06-08 VITALS — BP 168/98 | HR 94 | Temp 98.3°F | Resp 16 | Ht 68.0 in | Wt 205.6 lb

## 2017-06-08 DIAGNOSIS — K644 Residual hemorrhoidal skin tags: Secondary | ICD-10-CM

## 2017-06-08 DIAGNOSIS — R197 Diarrhea, unspecified: Secondary | ICD-10-CM | POA: Diagnosis not present

## 2017-06-08 DIAGNOSIS — F4321 Adjustment disorder with depressed mood: Secondary | ICD-10-CM

## 2017-06-08 NOTE — Progress Notes (Signed)
Subjective:  Patient ID: Austin Bray, male    DOB: 1941/06/05  Age: 76 y.o. MRN: [id number]  CC: The primary encounter diagnosis was Diarrhea of presumed infectious origin. Diagnoses of Inflamed external hemorrhoid and Grief reaction were also pertinent to this visit.  HPI Austin Bray presents for evaluation and and treatment  of diarrhea and hemorrhoids.  Diarrhea has been present for 6 weeks.  Loose stools are occurring  to 4 times daily. Rarely after he goes to bed, but still happens . Marland Kitchen  Has hemorrhoids,  Using preparation H suppositories and tucks wipes .  Has only had 2 or 3 solid stools in the last 6 weeks and they have occurred only after use of immodium .   Diet reviewed: Eating cereal with milk in the am,  apple as snack,  Lunch is either Peanut butter crackers,  A few corn chips,  Or a Klondike Bars (no sugar added)  Which he limits to once daily ,  Eats  almonds /other nuts , protein bars. Has been avoiding green vegetables ,  Not much animal protein .     Recently ate romaine lettuce, in the setting of a recent  recall of all romaine lettuce  for E Coli contamination; he wonders if this could be the cause.  However, he denies LLQ pan,  diffuse abdominal cramping, fevers, and malaise ,  just rectal pain .  Had lost 30 lbs last year during wife's last year of life battling glioblastoma cancer.  But gained most of it back after she passed except for 5 lbs.     History of colonoscopy in n 2010,  Normal except for diverticulosis.,   Has not taken an antibiotic since June    Outpatient Medications Prior to Visit  Medication Sig Dispense Refill  . etodolac (LODINE) 400 MG tablet Take 1 tablet (400 mg total) by mouth 2 (two) times daily. Take two by mouth daily. 180 tablet 1  . glucose blood test strip Use as instructed to check Blood glucose up to three times a day. Dispense contour plus strips.E11.9 100 each 12  . JUBLIA 10 % SOLN     . metFORMIN (GLUCOPHAGE-XR) 500 MG 24 hr  tablet TAKE 1 TABLET BY MOUTH DAILY FOR 7 DAYS, THEN INCREASE TO 2 TABLETS BY MOUTH DAILY 60 tablet 0  . nystatin cream (MYCOSTATIN) Apply 1 application topically 2 (two) times daily. 30 g 0  . omeprazole (PRILOSEC) 20 MG capsule TAKE 1 CAPSULE DAILY 90 capsule 0  . sertraline (ZOLOFT) 100 MG tablet TAKE 0.5 TABLETS (50 MG TOTAL) BY MOUTH DAILY. 30 tablet 0  . simvastatin (ZOCOR) 40 MG tablet Take 1 tablet (40 mg total) by mouth daily at 6 PM. Take one by mouth daily. 90 tablet 3  . traZODone (DESYREL) 150 MG tablet TAKE 2 TABLETS AT BEDTIME AT NIGHT 180 tablet 1  . chlorthalidone (HYGROTON) 25 MG tablet Take one tablet by mouth daily.    . benzonatate (TESSALON) 200 MG capsule Take 1 capsule (200 mg total) by mouth 2 (two) times daily as needed for cough. (Patient not taking: Reported on 06/08/2017) 20 capsule 0  . sertraline (ZOLOFT) 100 MG tablet TAKE 1 TABLET DAILY (Patient not taking: Reported on 06/08/2017) 90 tablet 2  . traZODone (DESYREL) 50 MG tablet TAKE 1 TABLET BY MOUTH EVERYDAY AT BEDTIME (Patient not taking: Reported on 06/08/2017) 30 tablet 0   No facility-administered medications prior to visit.     Review of  Systems;  Patient denies headache, fevers, malaise, unintentional weight loss, skin rash, eye pain, sinus congestion and sinus pain, sore throat, dysphagia,  hemoptysis , cough, dyspnea, wheezing, chest pain, palpitations, orthopnea, edema, abdominal pain, nausea, melena, constipation, flank pain, dysuria, hematuria, urinary  Frequency, nocturia, numbness, tingling, seizures,  Focal weakness, Loss of consciousness,  Tremor, insomnia, depression, anxiety, and suicidal ideation.      Objective:  BP (!) 168/98 (BP Location: Right Arm, Patient Position: Sitting, Cuff Size: Normal)   Pulse 94   Temp 98.3 F (36.8 C) (Oral)   Resp 16   Ht _0  (1.727 m)   Wt 205 lb 9.6 oz (93.3 kg)   SpO2 97%   BMI 31.26 kg/m   BP Readings from Last 3 Encounters:  06/08/17 (!) 168/98   12/21/16 138/62  12/21/16 138/62    Wt Readings from Last 3 Encounters:  06/08/17 205 lb 9.6 oz (93.3 kg)  12/21/16 197 lb 12.8 oz (89.7 kg)  12/21/16 197 lb 12.8 oz (89.7 kg)    General appearance: alert, cooperative and appears stated age Ears: normal TM's and external ear canals both ears Throat: lips, mucosa, and tongue normal; teeth and gums normal Neck: no adenopathy, no carotid bruit, supple, symmetrical, trachea midline and thyroid not enlarged, symmetric, no tenderness/mass/nodules Back: symmetric, no curvature. ROM normal. No CVA tenderness. Lungs: clear to auscultation bilaterally Heart: regular rate and rhythm, S1, S2 normal, no murmur, click, rub or gallop Abdomen: soft, non-tender; bowel sounds normal; no masses,  no organomegaly Pulses: 2+ and symmetric Skin: Skin color, texture, turgor normal. No rashes or lesions Lymph nodes: Cervical, supraclavicular, and axillary nodes normal.  Lab Results  Component Value Date   HGBA1C 7.4 (H) 12/22/2016   HGBA1C 8.0 (H) 11/11/2015   HGBA1C 6.4 04/21/2015    Lab Results  Component Value Date   CREATININE 1.15 06/08/2017   CREATININE 1.13 12/22/2016   CREATININE 1.17 11/11/2015    Lab Results  Component Value Date   WBC 5.7 06/08/2017   HGB 14.1 06/08/2017   HCT 39.9 06/08/2017   PLT 200 06/08/2017   GLUCOSE 134 (H) 06/08/2017   CHOL 163 12/22/2016   TRIG 104.0 12/22/2016   HDL 37.50 (L) 12/22/2016   LDLDIRECT 73.0 04/21/2015   LDLCALC 105 (H) 12/22/2016   ALT 23 06/08/2017   AST 22 06/08/2017   NA 140 06/08/2017   K 4.6 06/08/2017   CL 104 06/08/2017   CREATININE 1.15 06/08/2017   BUN 27 (H) 06/08/2017   CO2 24 06/08/2017   TSH 0.89 04/21/2015   PSA 0.65 10/23/2007   HGBA1C 7.4 (H) 12/22/2016   MICROALBUR 1.1 02/26/2010    Mr Lumbar Spine Wo Contrast  Result Date: 11/20/2015 CLINICAL DATA:  Low back pain for 10 years.  No articular symptoms. EXAM: MRI LUMBAR SPINE WITHOUT CONTRAST TECHNIQUE:  Multiplanar, multisequence MR imaging of the lumbar spine was performed. No intravenous contrast was administered. COMPARISON:  11/19/2008 FINDINGS: Stable alignment of the lumbar vertebral bodies. There is very slight degenerative retrolisthesis of L3. The vertebral bodies demonstrate normal marrow signal except for endplate reactive changes at L3-4. Conus medullaris terminates at the top of L1. Moderate facet disease but no definite pars defects. No significant paraspinal or retroperitoneal findings. L1-2: No significant findings. Mild bulging annulus with mild bilateral lateral recess encroachment. Mild facet disease. L2-3: Mild bulging annulus and mild facet disease with mild bilateral lateral recess encroachment. No spinal or foraminal stenosis. L3-4: Diffuse bulging and slightly uncovered  disc, osteophytic ridging, short pedicles, facet disease and ligamentum flavum thickening all contributing to moderately severe spinal and bilateral lateral recess stenosis. There is also a mild to moderate bilateral foraminal stenosis. Findings appear relatively stable when compared to the prior examination. L4-5: Bulging annulus and moderate facet disease with mild bilateral lateral recess stenosis. No spinal or foraminal stenosis. L5-S1: Broad-based bulging annulus and advanced facet disease but no significant spinal or foraminal stenosis. Mild foraminal encroachment bilaterally. IMPRESSION: 1. Stable mild bilateral lateral recess encroachment at L1-2, L2-3 and L4-5. 2. 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 Mild to moderate bilateral foraminal stenosis also. 3. Stable advanced facet disease in the lower lumbar spine. Electronically Signed   By: Marijo Sanes M.D.   On: 11/20/2015 15:18    Assessment & Plan:   Problem List Items Addressed This Visit    Diarrhea, unspecified - Primary    CBC ESR and CRP are normal.  If Screening for bacterial and viral pathogens including C  dificile given use of antibitoics this summer, will suspend metformin amd omeprazole for one month;   If still present, would then refer to GI for evaluation to rule out noninfectious forms of colitis.   .  Lab Results  Component Value Date   ESRSEDRATE 9 06/08/2017   Lab Results  Component Value Date   CRP 0.9 06/08/2017    Lab Results  Component Value Date   WBC 5.7 06/08/2017   HGB 14.1 06/08/2017   HCT 39.9 06/08/2017   MCV 87.3 06/08/2017   PLT 200 06/08/2017         Relevant Orders   Sedimentation rate (Completed)   C-reactive protein (Completed)   Comprehensive metabolic panel (Completed)   Magnesium (Completed)   CBC with Differential/Platelet (Completed)   Clostridium Difficile by PCR   Stool culture   Gastrointestinal Pathogen Panel PCR   Grief reaction    He appears to be adapting to the loss of his wife.  He is able to verbalize his feelings and express his need for male companionship. Encouraged him to consider his alternative first stop of getting a dog for companionship, which he thinks makes sense.        Inflamed external hemorrhoid    Secondary to recurrent diarrhea.  Supportive care outlined,  He denies pain and bleeding          I have discontinued Ferdinand Lango. [place]'s benzonatate. I am also having him maintain his chlorthalidone, JUBLIA, simvastatin, glucose blood, etodolac, nystatin cream, metFORMIN, omeprazole, traZODone, and sertraline.  No orders of the defined types were placed in this encounter.   Medications Discontinued During This Encounter  Medication Reason  . benzonatate (TESSALON) 200 MG capsule Patient has not taken in last 30 days  . sertraline (ZOLOFT) 476 MG tablet Duplicate  . traZODone (DESYREL) 50 MG tablet Patient has not taken in last 30 days    Follow-up: No Follow-up on file.   Crecencio Mc, MD

## 2017-06-08 NOTE — Patient Instructions (Addendum)
The following ingredients are very helpful in relieving the pain from irritated hemorrhoids:  Nupercainal   Pramoxine/hydrocortisone     I would like to test your stool for signs of infection using the tests ordered  If they are negative  A colonoscopy will be a good idea and we will arrange for that referral

## 2017-06-09 LAB — CBC WITH DIFFERENTIAL/PLATELET
Basophils Absolute: 51 cells/uL (ref 0–200)
Basophils Relative: 0.9 %
EOS PCT: 2.5 %
Eosinophils Absolute: 143 cells/uL (ref 15–500)
HEMATOCRIT: 39.9 % (ref 38.5–50.0)
HEMOGLOBIN: 14.1 g/dL (ref 13.2–17.1)
LYMPHS ABS: 1579 {cells}/uL (ref 850–3900)
MCH: 30.9 pg (ref 27.0–33.0)
MCHC: 35.3 g/dL (ref 32.0–36.0)
MCV: 87.3 fL (ref 80.0–100.0)
MONOS PCT: 10 %
MPV: 10 fL (ref 7.5–12.5)
NEUTROS ABS: 3357 {cells}/uL (ref 1500–7800)
Neutrophils Relative %: 58.9 %
Platelets: 200 10*3/uL (ref 140–400)
RBC: 4.57 10*6/uL (ref 4.20–5.80)
RDW: 12.2 % (ref 11.0–15.0)
Total Lymphocyte: 27.7 %
WBC mixed population: 570 cells/uL (ref 200–950)
WBC: 5.7 10*3/uL (ref 3.8–10.8)

## 2017-06-09 LAB — COMPREHENSIVE METABOLIC PANEL
AG Ratio: 1.8 (calc) (ref 1.0–2.5)
ALBUMIN MSPROF: 4.4 g/dL (ref 3.6–5.1)
ALT: 23 U/L (ref 9–46)
AST: 22 U/L (ref 10–35)
Alkaline phosphatase (APISO): 55 U/L (ref 40–115)
BUN / CREAT RATIO: 23 (calc) — AB (ref 6–22)
BUN: 27 mg/dL — AB (ref 7–25)
CHLORIDE: 104 mmol/L (ref 98–110)
CO2: 24 mmol/L (ref 20–32)
CREATININE: 1.15 mg/dL (ref 0.70–1.18)
Calcium: 9.4 mg/dL (ref 8.6–10.3)
GLOBULIN: 2.4 g/dL (ref 1.9–3.7)
GLUCOSE: 134 mg/dL — AB (ref 65–99)
POTASSIUM: 4.6 mmol/L (ref 3.5–5.3)
Sodium: 140 mmol/L (ref 135–146)
Total Bilirubin: 0.6 mg/dL (ref 0.2–1.2)
Total Protein: 6.8 g/dL (ref 6.1–8.1)

## 2017-06-09 LAB — SEDIMENTATION RATE: SED RATE: 9 mm/h (ref 0–20)

## 2017-06-09 LAB — MAGNESIUM: MAGNESIUM: 1.9 mg/dL (ref 1.5–2.5)

## 2017-06-09 LAB — C-REACTIVE PROTEIN: CRP: 0.9 mg/L (ref <8.0)

## 2017-06-10 DIAGNOSIS — K649 Unspecified hemorrhoids: Secondary | ICD-10-CM | POA: Insufficient documentation

## 2017-06-10 NOTE — Assessment & Plan Note (Signed)
He appears to be adapting to the loss of his wife.  He is able to verbalize his feelings and express his need for male companionship. Encouraged him to consider his alternative first stop of getting a dog for companionship, which he thinks makes sense.

## 2017-06-10 NOTE — Assessment & Plan Note (Signed)
Secondary to recurrent diarrhea.  Supportive care outlined,  He denies pain and bleeding

## 2017-06-10 NOTE — Assessment & Plan Note (Signed)
CBC ESR and CRP are normal.  If Screening for bacterial and viral pathogens including C dificile given use of antibitoics this summer, will suspend metformin amd omeprazole for one month;   If still present, would then refer to GI for evaluation to rule out noninfectious forms of colitis.   .  Lab Results  Component Value Date   ESRSEDRATE 9 06/08/2017   Lab Results  Component Value Date   CRP 0.9 06/08/2017    Lab Results  Component Value Date   WBC 5.7 06/08/2017   HGB 14.1 06/08/2017   HCT 39.9 06/08/2017   MCV 87.3 06/08/2017   PLT 200 06/08/2017

## 2017-06-13 ENCOUNTER — Other Ambulatory Visit: Payer: Medicare Other

## 2017-06-13 ENCOUNTER — Telehealth: Payer: Self-pay | Admitting: Family Medicine

## 2017-06-13 DIAGNOSIS — R197 Diarrhea, unspecified: Secondary | ICD-10-CM

## 2017-06-13 NOTE — Telephone Encounter (Signed)
Reviewed lab results and physician note with patient.  He stated he would hold metformin and omeprazole for one month. He stated that holding the omeprazole might cause him to take a lot of tums.  Advised him to call back if this became an issue. He stated he would bring stool specimen in soon.

## 2017-06-14 LAB — CLOSTRIDIUM DIFFICILE BY PCR: Toxigenic C. Difficile by PCR: NOT DETECTED

## 2017-06-17 LAB — STOOL CULTURE
MICRO NUMBER: 81323650
MICRO NUMBER: 81323651
MICRO NUMBER:: 81323652
SHIGA RESULT:: NOT DETECTED
SPECIMEN QUALITY: ADEQUATE
SPECIMEN QUALITY: ADEQUATE
SPECIMEN QUALITY: ADEQUATE

## 2017-06-21 LAB — GASTROINTESTINAL PATHOGEN PANEL PCR
C. DIFFICILE TOX A/B, PCR: NOT DETECTED
Campylobacter, PCR: NOT DETECTED
Cryptosporidium, PCR: NOT DETECTED
E COLI (ETEC) LT/ST, PCR: NOT DETECTED
E COLI 0157, PCR: NOT DETECTED
E coli (STEC) stx1/stx2, PCR: NOT DETECTED
Giardia lamblia, PCR: NOT DETECTED
Norovirus, PCR: NOT DETECTED
Rotavirus A, PCR: NOT DETECTED
SALMONELLA, PCR: NOT DETECTED
SHIGELLA, PCR: NOT DETECTED

## 2017-06-23 ENCOUNTER — Telehealth: Payer: Self-pay

## 2017-06-23 NOTE — Telephone Encounter (Signed)
Copied from [place]. Topic: General - Other >> Jun 23, 2017  4:21 PM Marin Olp L wrote: Reason for CRM: Missed cal from [place], not documentation.

## 2017-06-24 NOTE — Telephone Encounter (Signed)
See result note.  

## 2017-06-24 NOTE — Telephone Encounter (Signed)
Janett Billow, sorry, not sure what you mean for result note. Please advise. Pt would like a call back.

## 2017-07-04 ENCOUNTER — Encounter: Payer: Self-pay | Admitting: Internal Medicine

## 2017-07-04 ENCOUNTER — Ambulatory Visit (INDEPENDENT_AMBULATORY_CARE_PROVIDER_SITE_OTHER): Payer: BLUE CROSS/BLUE SHIELD | Admitting: Internal Medicine

## 2017-07-04 VITALS — BP 120/80 | HR 124 | Temp 98.1°F | Resp 17 | Ht 68.0 in | Wt 205.8 lb

## 2017-07-04 DIAGNOSIS — K644 Residual hemorrhoidal skin tags: Secondary | ICD-10-CM

## 2017-07-04 DIAGNOSIS — F339 Major depressive disorder, recurrent, unspecified: Secondary | ICD-10-CM | POA: Diagnosis not present

## 2017-07-04 DIAGNOSIS — F4321 Adjustment disorder with depressed mood: Secondary | ICD-10-CM | POA: Diagnosis not present

## 2017-07-04 DIAGNOSIS — K625 Hemorrhage of anus and rectum: Secondary | ICD-10-CM | POA: Diagnosis not present

## 2017-07-04 DIAGNOSIS — Z23 Encounter for immunization: Secondary | ICD-10-CM

## 2017-07-04 DIAGNOSIS — E119 Type 2 diabetes mellitus without complications: Secondary | ICD-10-CM

## 2017-07-04 LAB — POCT GLYCOSYLATED HEMOGLOBIN (HGB A1C): HEMOGLOBIN A1C: 7.7

## 2017-07-04 MED ORDER — ALPRAZOLAM 0.25 MG PO TABS: 0.2500 mg | ORAL_TABLET | Freq: Two times a day (BID) | ORAL | 0 refills | Status: DC | PRN

## 2017-07-04 MED ORDER — SERTRALINE HCL 100 MG PO TABS: 100.0000 mg | ORAL_TABLET | Freq: Every day | ORAL | 2 refills | Status: DC

## 2017-07-04 NOTE — Progress Notes (Signed)
Subjective:  Patient ID: Austin Bray, male    DOB: 1941-03-03  Age: 76 y.o. MRN: [id number]  CC: The primary encounter diagnosis was Grief. Diagnoses of Controlled type 2 diabetes mellitus without complication, without long-term current use of insulin ([place]), Rectal bleeding, Encounter for immunization, Major depressive disorder, recurrent episode with anxious distress ([place]), and Inflamed external hemorrhoid were also pertinent to this visit.  HPI DALESSANDRO BALDYGA presents for FOLLOW UP ON DIARRHEA AND uncontrolled GRIEF OVER LOSS OF WIFE in March   Patient was last seen 4 weeks ago and evaluated for 6 month history of diarrhea.  Infectious causes were ruled out and he was advised to suspend omeprazole and metformin as a trial.  Unfortunately he decided to stop ALL of his medications last month,  including Zoloft and Trazodone.   As a result, his grief over the loss of his beloved wife of >50 years has deteriorated into profound depression .  He has had decreased social contact, increased tearfulness and despair, but is not suicidal, although he volunteered freely that .   "I LOCKED UP MY GUN. "  His support system is limited to two grown sons who live nearby,  and a house cleaner, who insisted on decorating his home for the holiday.  By report, his sons appear to have grown tired of fielding his grief stricken messages and have actually scolded hm for waking them up with text messages.  He is tearful today and is frequently overcome with emotion when he speaks of his wife.    He is tremulous today but articulate between spasms of grief.  He has not eaten in 5 hours  and feels he is having a low blood sugar  .  He takes no medications currently for his diabetes.  He has consumed several glucose tablets and a KIND bar in my presence with rapid resolution of hypoglycemic symptoms.    40 minutes was spent with patient today in counselling, included a requested discussion of his sexual reawakening. He  has only had one sexual partner in his entire life (his wife) and has been watching more pornography lately (but he is not ashamed and recalls hat he and his wife would watch porn together) . He is considering paying a prostitute for "companionship and snuggling" which he misses as much as the sexual release.   His Diarrhea has resolved.  He reports only  2 "blowouts" in the past month.  Stool is still on the soft side but no longer watery,  And he occasionally skips a day , which he refers to as being  constipated . However he continues to have scant amounts of  bright red blood with most stools.  Outpatient Medications Prior to Visit  Medication Sig Dispense Refill  . chlorthalidone (HYGROTON) 25 MG tablet Take one tablet by mouth daily.    Marland Kitchen etodolac (LODINE) 400 MG tablet Take 1 tablet (400 mg total) by mouth 2 (two) times daily. Take two by mouth daily. 180 tablet 1  . glucose blood test strip Use as instructed to check Blood glucose up to three times a day. Dispense contour plus strips.E11.9 100 each 12  . JUBLIA 10 % SOLN     . metFORMIN (GLUCOPHAGE-XR) 500 MG 24 hr tablet TAKE 1 TABLET BY MOUTH DAILY FOR 7 DAYS, THEN INCREASE TO 2 TABLETS BY MOUTH DAILY 60 tablet 0  . nystatin cream (MYCOSTATIN) Apply 1 application topically 2 (two) times daily. 30 g 0  . omeprazole (  PRILOSEC) 20 MG capsule TAKE 1 CAPSULE DAILY 90 capsule 0  . simvastatin (ZOCOR) 40 MG tablet Take 1 tablet (40 mg total) by mouth daily at 6 PM. Take one by mouth daily. 90 tablet 3  . traZODone (DESYREL) 150 MG tablet TAKE 2 TABLETS AT BEDTIME AT NIGHT 180 tablet 1  . sertraline (ZOLOFT) 100 MG tablet TAKE 0.5 TABLETS (50 MG TOTAL) BY MOUTH DAILY. 30 tablet 0   No facility-administered medications prior to visit.     Review of Systems;  Patient denies headache, fevers, malaise, unintentional weight loss, skin rash, eye pain, sinus congestion and sinus pain, sore throat, dysphagia,  hemoptysis , cough, dyspnea, wheezing,  chest pain, palpitations, orthopnea, edema, abdominal pain, nausea, melena, diarrhea, constipation, flank pain, dysuria, hematuria, urinary  Frequency, nocturia, numbness, tingling, seizures,  Focal weakness, Loss of consciousness,  and suicidal ideation.      Objective:  BP 120/80 (BP Location: Left Arm, Patient Position: Sitting, Cuff Size: Normal)   Pulse (!) 124   Temp 98.1 F (36.7 C) (Oral)   Resp 17   Ht 5\' 8"  (1.727 m)   Wt 205 lb 12.8 oz (93.4 kg)   SpO2 97%   BMI 31.29 kg/m   BP Readings from Last 3 Encounters:  07/04/17 120/80  06/08/17 (!) 168/98  12/21/16 138/62    Wt Readings from Last 3 Encounters:  07/04/17 205 lb 12.8 oz (93.4 kg)  06/08/17 205 lb 9.6 oz (93.3 kg)  12/21/16 197 lb 12.8 oz (89.7 kg)    General appearance: alert, cooperative and appears stated age Ears: normal TM's and external ear canals both ears Throat: lips, mucosa, and tongue normal; teeth and gums normal Neck: no adenopathy, no carotid bruit, supple, symmetrical, trachea midline and thyroid not enlarged, symmetric, no tenderness/mass/nodules Back: symmetric, no curvature. ROM normal. No CVA tenderness. Lungs: clear to auscultation bilaterally Heart: regular rate and rhythm, S1, S2 normal, no murmur, click, rub or gallop Abdomen: soft, non-tender; bowel sounds normal; no masses,  no organomegaly Pulses: 2+ and symmetric Skin: Skin color, texture, turgor normal. No rashes or lesions Lymph nodes: Cervical, supraclavicular, and axillary nodes normal. Psych: affect sad and anxious,  makes good eye contact. No fidgeting,  Cries easily.  Denies suicidal intent .    Lab Results  Component Value Date   HGBA1C 7.7 07/04/2017   HGBA1C 7.4 (H) 12/22/2016   HGBA1C 8.0 (H) 11/11/2015    Lab Results  Component Value Date   CREATININE 1.15 06/08/2017   CREATININE 1.13 12/22/2016   CREATININE 1.17 11/11/2015    Lab Results  Component Value Date   WBC 5.7 06/08/2017   HGB 14.1 06/08/2017     HCT 39.9 06/08/2017   PLT 200 06/08/2017   GLUCOSE 134 (H) 06/08/2017   CHOL 163 12/22/2016   TRIG 104.0 12/22/2016   HDL 37.50 (L) 12/22/2016   LDLDIRECT 73.0 04/21/2015   LDLCALC 105 (H) 12/22/2016   ALT 23 06/08/2017   AST 22 06/08/2017   NA 140 06/08/2017   K 4.6 06/08/2017   CL 104 06/08/2017   CREATININE 1.15 06/08/2017   BUN 27 (H) 06/08/2017   CO2 24 06/08/2017   TSH 0.89 04/21/2015   PSA 0.65 10/23/2007   HGBA1C 7.7 07/04/2017   MICROALBUR 1.1 02/26/2010    Mr Lumbar Spine Wo Contrast  Result Date: 11/20/2015 CLINICAL DATA:  Low back pain for 10 years.  No articular symptoms. EXAM: MRI LUMBAR SPINE WITHOUT CONTRAST TECHNIQUE: Multiplanar, multisequence MR imaging  of the lumbar spine was performed. No intravenous contrast was administered. COMPARISON:  11/19/2008 FINDINGS: Stable alignment of the lumbar vertebral bodies. There is very slight degenerative retrolisthesis of L3. The vertebral bodies demonstrate normal marrow signal except for endplate reactive changes at L3-4. Conus medullaris terminates at the top of L1. Moderate facet disease but no definite pars defects. No significant paraspinal or retroperitoneal findings. L1-2: No significant findings. Mild bulging annulus with mild bilateral lateral recess encroachment. Mild facet disease. L2-3: Mild bulging annulus and mild facet disease with mild bilateral lateral recess encroachment. No spinal or foraminal stenosis. L3-4: Diffuse bulging and slightly uncovered disc, osteophytic ridging, short pedicles, facet disease and ligamentum flavum thickening all contributing to moderately severe spinal and bilateral lateral recess stenosis. There is also a mild to moderate bilateral foraminal stenosis. Findings appear relatively stable when compared to the prior examination. L4-5: Bulging annulus and moderate facet disease with mild bilateral lateral recess stenosis. No spinal or foraminal stenosis. L5-S1: Broad-based bulging annulus  and advanced facet disease but no significant spinal or foraminal stenosis. Mild foraminal encroachment bilaterally. IMPRESSION: 1. Stable mild bilateral lateral recess encroachment at L1-2, L2-3 and L4-5. 2. 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 Mild to moderate bilateral foraminal stenosis also. 3. Stable advanced facet disease in the lower lumbar spine. Electronically Signed   By: Marijo Sanes M.D.   On: 11/20/2015 15:18    Assessment & Plan:   Problem List Items Addressed This Visit    Diabetes mellitus type 2, controlled ([place])    Slight loss of control by today's repeat A1c. However, given his age, independent living status, resolution of diarrhea,  and altered eating habits leading to hypoglycemia, I have elected NOT to resume metformin  At this point in time.  Follow up with PCP Sonnenberg in 1 month       Relevant Orders   POCT HgB A1C (Completed)   Inflamed external hemorrhoid    With persistent bleeding despite resolution of diarrhea..  Referral to [facility] for management.       Major depressive disorder, recurrent episode with anxious distress ([place])    Aggravated by the loss of his wife in march to glioblastoma,  Complicated by his rash decision to discontinue zoloft and trazodone.  He has resumed zoloft on his own as of 48 hours ago ans is tolerate the full 100 mg dose without nausea.  Adding alprazolam (up to 2 daily) to manage his uncontrolled anxiety .  His sons will be with him for the Christmas holiday.  Grief counselling was advised at last visit but deferred; he is now accepting of the referral.       Relevant Medications   ALPRAZolam (XANAX) 0.25 MG tablet   sertraline (ZOLOFT) 100 MG tablet    Other Visit Diagnoses    Grief    -  Primary   Relevant Orders   Ambulatory referral to Psychology   Rectal bleeding       Relevant Orders   Ambulatory referral to General Surgery   Encounter for immunization       Relevant  Orders   Flu vaccine HIGH DOSE PF (Completed)    A total of 40 minutes was spent with patient more than half of which was spent in counseling patient on the above mentioned issues , reviewing and explaining recent labs,  and coordination of care.  I have changed Ferdinand Lango. Kimoto's sertraline. I am also having him start on ALPRAZolam.  Additionally, I am having him maintain his chlorthalidone, JUBLIA, simvastatin, glucose blood, etodolac, nystatin cream, metFORMIN, omeprazole, and traZODone.  Meds ordered this encounter  Medications  . ALPRAZolam (XANAX) 0.25 MG tablet    Sig: Take 1 tablet (0.25 mg total) by mouth 2 (two) times daily as needed for anxiety.    Dispense:  60 tablet    Refill:  0  . sertraline (ZOLOFT) 100 MG tablet    Sig: Take 1 tablet (100 mg total) by mouth daily.    Dispense:  90 tablet    Refill:  2    Medications Discontinued During This Encounter  Medication Reason  . sertraline (ZOLOFT) 100 MG tablet Reorder    Follow-up: No Follow-up on file.   Crecencio Mc, MD

## 2017-07-04 NOTE — Patient Instructions (Addendum)
I am prescribing you a mildly sedating medication for your anxiety called alprazolam. You can take up to 2 times daily.  It will take the zoloft up to two weeks to help.   Once you start to feel better on the zoloft,  You can reduce your use of alprazolam gradually   I have ordered grief counselling ,  Because you need it.  The office will call you when we have made an appointment   Do not resume the metformin or the omeprazole yet.   I am referring you to  Dr Hervey Ard for evaluation of your rectal bleeding

## 2017-07-05 ENCOUNTER — Telehealth: Payer: Self-pay | Admitting: General Surgery

## 2017-07-05 NOTE — Assessment & Plan Note (Signed)
Aggravated by the loss of his wife in march to glioblastoma,  Complicated by his rash decision to discontinue zoloft and trazodone.  He has resumed zoloft on his own as of 48 hours ago ans is tolerate the full 100 mg dose without nausea.  Adding alprazolam (up to 2 daily) to manage his uncontrolled anxiety .  His sons will be with him for the Christmas holiday.  Grief counselling was advised at last visit but deferred; he is now accepting of the referral.

## 2017-07-05 NOTE — Assessment & Plan Note (Signed)
Slight loss of control by today's repeat A1c. However, given his age, independent living status, resolution of diarrhea,  and altered eating habits leading to hypoglycemia, I have elected NOT to resume metformin  At this point in time.  Follow up with PCP Sonnenberg in 1 month

## 2017-07-05 NOTE — Assessment & Plan Note (Addendum)
With persistent bleeding despite resolution of diarrhea..  Referral to [facility] for management.

## 2017-07-05 NOTE — Telephone Encounter (Signed)
I CALLED PATIENT AND LEFT A MESSAGE FOR HIM TO BACK AND SCHEDULE AN APPOINTMENT FOR DR BYRNETT.HE IS HAVING PERSISTENT RECTAL BLEEDING WITH SOFT STOOL,HAS HAD A COLONOSCOPY ON 01-14-09 WITH [facility] ALL INFO IS IN Epic.REFERRED BY DR Derrel Nip.

## 2017-07-06 NOTE — Progress Notes (Signed)
Left message to return call, ok for [facility] to schedule patient for 1 month follow up with Dr.Sonnenberg

## 2017-07-07 ENCOUNTER — Encounter: Payer: Self-pay | Admitting: *Deleted

## 2017-07-15 NOTE — Progress Notes (Signed)
scheduled

## 2017-07-21 ENCOUNTER — Encounter: Payer: Self-pay | Admitting: *Deleted

## 2017-07-21 ENCOUNTER — Ambulatory Visit: Payer: Self-pay | Admitting: General Surgery

## 2017-08-01 ENCOUNTER — Telehealth: Payer: Self-pay | Admitting: Family Medicine

## 2017-08-01 NOTE — Telephone Encounter (Signed)
Patient given Dr. Bary Castilla number and was advised to recall office and reschedule missed appointment.  Address: [address], [place], [place] [address]  Phone: [phone]

## 2017-08-08 ENCOUNTER — Ambulatory Visit: Payer: BLUE CROSS/BLUE SHIELD | Admitting: Family Medicine

## 2017-08-09 ENCOUNTER — Other Ambulatory Visit: Payer: Self-pay

## 2017-08-09 ENCOUNTER — Ambulatory Visit (INDEPENDENT_AMBULATORY_CARE_PROVIDER_SITE_OTHER): Payer: BLUE CROSS/BLUE SHIELD | Admitting: Family Medicine

## 2017-08-09 ENCOUNTER — Ambulatory Visit (INDEPENDENT_AMBULATORY_CARE_PROVIDER_SITE_OTHER): Payer: BLUE CROSS/BLUE SHIELD

## 2017-08-09 ENCOUNTER — Encounter: Payer: Self-pay | Admitting: Family Medicine

## 2017-08-09 VITALS — BP 140/82 | HR 76 | Temp 98.2°F | Wt 208.2 lb

## 2017-08-09 DIAGNOSIS — E785 Hyperlipidemia, unspecified: Secondary | ICD-10-CM

## 2017-08-09 DIAGNOSIS — K625 Hemorrhage of anus and rectum: Secondary | ICD-10-CM | POA: Diagnosis not present

## 2017-08-09 DIAGNOSIS — F4321 Adjustment disorder with depressed mood: Secondary | ICD-10-CM

## 2017-08-09 DIAGNOSIS — E119 Type 2 diabetes mellitus without complications: Secondary | ICD-10-CM

## 2017-08-09 DIAGNOSIS — G8929 Other chronic pain: Secondary | ICD-10-CM | POA: Diagnosis not present

## 2017-08-09 DIAGNOSIS — F339 Major depressive disorder, recurrent, unspecified: Secondary | ICD-10-CM | POA: Diagnosis not present

## 2017-08-09 DIAGNOSIS — F320 Major depressive disorder, single episode, mild: Secondary | ICD-10-CM

## 2017-08-09 DIAGNOSIS — E7800 Pure hypercholesterolemia, unspecified: Secondary | ICD-10-CM

## 2017-08-09 DIAGNOSIS — M545 Low back pain, unspecified: Secondary | ICD-10-CM

## 2017-08-09 DIAGNOSIS — M5136 Other intervertebral disc degeneration, lumbar region: Secondary | ICD-10-CM

## 2017-08-09 DIAGNOSIS — R197 Diarrhea, unspecified: Secondary | ICD-10-CM

## 2017-08-09 MED ORDER — SIMVASTATIN 40 MG PO TABS: 40.0000 mg | ORAL_TABLET | Freq: Every day | ORAL | 3 refills | Status: DC

## 2017-08-09 NOTE — Assessment & Plan Note (Addendum)
Patient has been doing better after starting back on Zoloft.  He will continue this.  We will refer to a therapist in our office.

## 2017-08-09 NOTE — Assessment & Plan Note (Signed)
Has improved.  Suspect related to metformin.  He does occasionally have hemorrhoidal bleeding and will have him evaluated for that.  Referral placed.

## 2017-08-09 NOTE — Assessment & Plan Note (Signed)
Patient with chronic low back pain.  Has been more persistent recently.  Will check an x-ray.  Will refer for physical therapy.  Consider referral to orthopedics if not improving.

## 2017-08-09 NOTE — Assessment & Plan Note (Signed)
Restart simvastatin.  He will return in 1 month for repeat lab work.

## 2017-08-09 NOTE — Assessment & Plan Note (Signed)
Patient wants to stay off medication at this time.  He will work on diet and exercise.

## 2017-08-09 NOTE — Assessment & Plan Note (Signed)
At this point I think this is more depression though does have some attributes of grief.  Has improved with Zoloft though he is interested in seeing a therapist.  Referral will be placed.  If worsens he will let us know.

## 2017-08-09 NOTE — Patient Instructions (Addendum)
Nice to see you. Please start back on the simvastatin.  We will have you come back in 1 month for recheck of your labs. Please try to work on diet and exercise to help with your diabetes. We will get you to see a psychologist and complete an x-ray of your back. If your depression worsens please let us know.  If you develop thoughts of harming yourself please go to the emergency room. We will get you to see GI as well for your rectal bleeding and hemorrhoids.

## 2017-08-09 NOTE — Progress Notes (Signed)
Tommi Rumps, MD Phone: [phone]  Austin Bray is a 77 y.o. male who presents today for follow-up.  Depression: This is a little bit better.  Zoloft has helped.  Not taking the Xanax currently.  Just does not feel inspired.  Does not want to get out.  No SI.  He is interested in seeing a therapist.  Hyperlipidemia: He has been off of simvastatin for weeks now.  He had no issues when taking it.  No myalgias or right upper quadrant pain.  Needs a refill.  Diabetes: Patient came off of metformin as it was causing some upset stomach and loose bowels.  He does not check sugars.  Last A1c was 7.7.  He was previously referred for evaluation of persistent diarrhea and rectal bleeding.  He did not make that appointment.  Notes his diarrhea has improved and only has issues once every 1-2 weeks.  He does note some hemorrhoidal bleeding at times.  Has history of chronic left low back pain.  This has been going on for a long time.  Is more persistent now.  No numbness, weakness, loss of bowel or bladder function, or saddle anesthesia.  No radiation.  He does not do any of this.  Social History   Tobacco Use  Smoking Status Former Smoker  Smokeless Tobacco Never Used     ROS see history of present illness  Objective  Physical Exam Vitals:   08/09/17 1008  BP: 140/82  Pulse: 76  Temp: 98.2 F (36.8 C)  SpO2: 96%    BP Readings from Last 3 Encounters:  08/09/17 140/82  07/04/17 120/80  06/08/17 (!) 168/98   Wt Readings from Last 3 Encounters:  08/09/17 208 lb 3.2 oz (94.4 kg)  07/04/17 205 lb 12.8 oz (93.4 kg)  06/08/17 205 lb 9.6 oz (93.3 kg)    Physical Exam  Constitutional: No distress.  Cardiovascular: Normal rate, regular rhythm and normal heart sounds.  Pulmonary/Chest: Effort normal and breath sounds normal.  Genitourinary:  Genitourinary Comments: External hemorrhoids noted that are non-inflamed or thrombosed  Musculoskeletal: He exhibits no edema.    Neurological: He is alert.  Able to rise from chair with no assistance, able to get onto exam table with no difficulty  Skin: Skin is warm and dry. He is not diaphoretic.     Assessment/Plan: Please see individual problem list.  Diabetes mellitus type 2, controlled Patient wants to stay off medication at this time.  He will work on diet and exercise.  Grief reaction At this point I think this is more depression though does have some attributes of grief.  Has improved with Zoloft though he is interested in seeing a therapist.  Referral will be placed.  If worsens he will let us know.  Diarrhea, unspecified Has improved.  Suspect related to metformin.  He does occasionally have hemorrhoidal bleeding and will have him evaluated for that.  Referral placed.  Major depressive disorder, recurrent episode with anxious distress [facility]) Patient has been doing better after starting back on Zoloft.  He will continue this.  We will refer to a therapist in our office.  DDD (degenerative disc disease), lumbar Patient with chronic low back pain.  Has been more persistent recently.  Will check an x-ray.  Will refer for physical therapy.  Consider referral to orthopedics if not improving.  HYPERCHOLESTEROLEMIA Restart simvastatin.  He will return in 1 month for repeat lab work.   Orders Placed This Encounter  Procedures  . DG Lumbar  Spine Complete    Standing Status:   Future    Number of Occurrences:   1    Standing Expiration Date:   10/08/2018    Order Specific Question:   Reason for Exam (SYMPTOM  OR DIAGNOSIS REQUIRED)    Answer:   chronic low back pain, left low back, more persistent    Order Specific Question:   Preferred imaging location?    Answer:   [facility] Specific Question:   Radiology Contrast Protocol - do NOT remove file path    Answer:   [email or web address]  . LDL cholesterol, direct    Standing Status:   Future     Standing Expiration Date:   08/09/2018  . Hepatic function panel    Standing Status:   Future    Standing Expiration Date:   08/09/2018  . Ambulatory referral to Psychology    Referral Priority:   Routine    Referral Type:   Psychiatric    Referral Reason:   Specialty Services Required    Requested Specialty:   Psychology    Number of Visits Requested:   1  . Ambulatory referral to Gastroenterology    Referral Priority:   Routine    Referral Type:   Consultation    Referral Reason:   Specialty Services Required    Number of Visits Requested:   1  . Ambulatory referral to Physical Therapy    Referral Priority:   Routine    Referral Type:   Physical Medicine    Referral Reason:   Specialty Services Required    Requested Specialty:   Physical Therapy    Number of Visits Requested:   1    Meds ordered this encounter  Medications  . DISCONTD: simvastatin (ZOCOR) 40 MG tablet    Sig: Take 1 tablet (40 mg total) by mouth daily at 6 PM. Take one by mouth daily.    Dispense:  90 tablet    Refill:  3  . simvastatin (ZOCOR) 40 MG tablet    Sig: Take 1 tablet (40 mg total) by mouth daily at 6 PM. Take one by mouth daily.    Dispense:  90 tablet    Refill:  [place], MD [place]

## 2017-08-10 ENCOUNTER — Encounter: Payer: Self-pay | Admitting: *Deleted

## 2017-08-16 ENCOUNTER — Encounter: Payer: Self-pay | Admitting: Gastroenterology

## 2017-08-16 ENCOUNTER — Ambulatory Visit: Payer: BLUE CROSS/BLUE SHIELD | Admitting: Gastroenterology

## 2017-08-24 ENCOUNTER — Ambulatory Visit: Payer: BLUE CROSS/BLUE SHIELD | Attending: Family Medicine | Admitting: Physical Therapy

## 2017-08-24 ENCOUNTER — Other Ambulatory Visit: Payer: Self-pay

## 2017-08-24 DIAGNOSIS — M6281 Muscle weakness (generalized): Secondary | ICD-10-CM | POA: Insufficient documentation

## 2017-08-24 DIAGNOSIS — G8929 Other chronic pain: Secondary | ICD-10-CM | POA: Diagnosis present

## 2017-08-24 DIAGNOSIS — M545 Low back pain: Secondary | ICD-10-CM | POA: Insufficient documentation

## 2017-08-24 DIAGNOSIS — R293 Abnormal posture: Secondary | ICD-10-CM | POA: Diagnosis present

## 2017-08-25 ENCOUNTER — Encounter: Payer: Self-pay | Admitting: Physical Therapy

## 2017-08-25 ENCOUNTER — Other Ambulatory Visit: Payer: Self-pay

## 2017-08-25 NOTE — Therapy (Signed)
[facility] [address], [place], [address] Phone: [phone]   Fax:  [phone]  Physical Therapy Evaluation  Patient Details  Name: Austin Bray MRN: [id number] Date of Birth: [birth year] Referring Provider: Leone Haven, MD   Encounter Date: 08/24/2017  PT End of Session - 08/25/17 1221    Visit Number  1    Number of Visits  13    Date for PT Re-Evaluation  10/05/17    PT Start Time  3818    PT Stop Time  1616    PT Time Calculation (min)  32 min    Activity Tolerance  Patient tolerated treatment well    Behavior During Therapy  [facility] for tasks assessed/performed       Past Medical History:  Diagnosis Date  . Chronic kidney disease    stones 30 years ago  . Depression   . Diabetes mellitus without complication ([place])   . GERD (gastroesophageal reflux disease)   . Hyperlipidemia   . Hypertension     Past Surgical History:  Procedure Laterality Date  . ANTERIOR CERVICAL DISCECTOMY  2015  . TONSILLECTOMY      There were no vitals filed for this visit.   Subjective Assessment - 08/24/17 1556    Subjective  Pt presents with chronic low back pain    Pertinent History  Very limited evaluation as pt went to the wrong clinic and arrived late. Pt reports he has had central low back pain since he was a teenager.  Had PT 15 years ago for low back pain with some success.  Aggravating factors: bending over, rising from bending, standing in one spot.  No issues with sitting, especially if he is reclined.  Easing factors: heat, therapy in the past.  Pt's wife passed away ~ 1 year ago (pt tearful and visibly upset about this, MD has made referral to therapist per chart review).  He says this is why he has not seen anyone about his back.  Current pain: 1/10, best pain: 1/10, worst pain 5/10.  Pt likes to play the guitar and to sing.  Pt had an epidural 3x in the past which were all ~15 years ago.  The  first time it helped, the following time he did not have relief.  Pain does not affect his sleep.  Pt sleeps in his recliner.  Pt is also having occasional pain in lower cervical region if he maintains cervical rotation.   Pt had ACDF ~4-5 years ago. Pt was scheduled to have lower back surgery 15 years ago and was going to have what he believes was a fusion but pt decided not to go forward with this as he did not trust the MD.  Pt denies any surgeries in low back.  Pt has had chiropractor work ~10 years ago which did help. Pt enjoys hunting, has not done any this year due to not feeling motivated. Pt with known h/o diarrhea every 1-2 weeks. No numbness or tingling, weakness, loss of bowel or bladder function, or saddle anesthesia.  No radiation.  Pt with h/o lumbar DDD. MRI on 08/09/17: Mild osteophytic changes are noted most prominent at L3-4 with associated disc space narrowing. Mild degenerative retrolisthesis of L3 on L4 is noted. Mild degenerative anterolisthesis of L5 with respect S1 is noted.     Limitations  Standing    How long can you sit comfortably?  no issues    How long  can you stand comfortably?  3 minutes    How long can you walk comfortably?  no issues    Diagnostic tests  see above    Patient Stated Goals  decrease pain    Currently in Pain?  Yes    Pain Score  1     Pain Location  Back    Pain Orientation  Lower    Pain Descriptors / Indicators  Aching    Pain Type  Chronic pain    Pain Radiating Towards  n/a    Pain Onset  More than a month ago    Pain Frequency  Constant    Aggravating Factors   see above    Pain Relieving Factors  see above    Effect of Pain on Daily Activities  see above    Multiple Pain Sites  No         [facility] PT Assessment - 08/25/17 0001      Assessment   Medical Diagnosis  Chronic left-sided low back pain without sciatica    Referring Provider  Leone Haven, MD    Onset Date/Surgical Date  -- Has had pain since he was a teenager    Hand  Dominance  Left    Next MD Visit  Unsure when this is    Prior Therapy  Yes, with success      Precautions   Precautions  None      Restrictions   Weight Bearing Restrictions  No      Balance Screen   Has the patient fallen in the past 6 months  No    Has the patient had a decrease in activity level because of a fear of falling?   No    Is the patient reluctant to leave their home because of a fear of falling?   No      Home Environment   Living Environment  Private residence    Living Arrangements  Alone    Type of [place] Access  Stairs to enter    Entrance Stairs-Number of Steps  5    Entrance Stairs-Rails  Left    Home Layout  Two level    Alternate Level Stairs-Number of Steps  20    Alternate Level Stairs-Rails  None    [place] - 2 wheels;Cane - single point      Prior Function   Level of Independence  Independent      Cognition   Overall Cognitive Status  Within Functional Limits for tasks assessed      ROM / Strength   AROM / PROM / Strength  Strength      Strength   Overall Strength  Deficits    Strength Assessment Site  Hip;Knee;Ankle    Right/Left Hip  Left;Right    Right Hip Flexion  5/5    Right Hip Extension  3-/5    Right Hip External Rotation   4+/5    Right Hip Internal Rotation  5/5    Right Hip ABduction  3/5    Right Hip ADduction  5/5    Left Hip Flexion  5/5    Left Hip Extension  3/5    Left Hip External Rotation  4+/5    Left Hip Internal Rotation  4/5    Left Hip ABduction  3/5    Left Hip ADduction  5/5    Right/Left Knee  Left;Right    Right Knee  Flexion  5/5    Right Knee Extension  5/5    Left Knee Flexion  5/5    Left Knee Extension  5/5    Right/Left Ankle  Left;Right    Right Ankle Dorsiflexion  5/5    Left Ankle Dorsiflexion  5/5       EXAMINATION   Palpation: Increased muscular tension noted in Bil lumbar paraspinals, TTP L glute max and med   Mobility: Hypomobile with CPAs to L2-5, pain  radiating to abdomen with CPA to L3. Hypomobile and painful with L UPA to L4.   Repeated trunk flexion: negative, pain is worse after 5 reps   Repeated trunk extension: negative   Trunk AROM in deg (L, R):  F: 100% with difficulty rising. Pain in central lower back.  E: 100%  Rotation: 75% with pain in L lower back, 100%  Lateral F: 100%, 100%   SLR Test: negative Bil   FABER Test: positive R hip   FADDIR test: positive Bil hips   Scour: negative Bil   SIJ Compression and Distraction Test: negative   Hamstring length: lacking 40 deg on LLE, lacking 26 deg on RLE        Objective measurements completed on examination: See above findings.              PT Education - 08/25/17 1220    Education provided  Yes    Education Details  Role of PT, POC, examination findings    Person(s) Educated  Patient    Methods  Explanation    Comprehension  Verbalized understanding;Need further instruction       PT Short Term Goals - 08/25/17 1238      PT SHORT TERM GOAL #1   Title  Pt will be independently completing HEP at least 4 days/wk for improved carryover between sessions.    Time  2    Period  Weeks    Status  New        PT Long Term Goals - 08/25/17 1239      PT LONG TERM GOAL #1   Title  Pt will improve Bil hip Abd and E strength to at least 4/5 for improved strength and support to low back    Baseline  L,R hip Abd: 3/5, 3/5.  L,R hip E: 3/5, 3-/5    Time  6    Period  Weeks    Status  New      PT LONG TERM GOAL #2   Title  Pt's Bil hamstring length will improve to at least 20 deg Bil to decrease pull and stress on lower back    Baseline  lacking 40 deg on LLE, lacking 26 deg on RLE    Time  4    Period  Weeks    Status  New      PT LONG TERM GOAL #3   Title  Pt's worst pain will decrease to at least 2/10 for improved QOL    Baseline  5/10    Time  3    Period  Weeks    Status  New             Plan - 08/25/17 1222    Clinical  Impression Statement  Pt is a 77 y/o M who presented with chronic LBP with onset ~60 years ago.  Pt has received PT in the past with some success but has not had PT for ~15 years.  Pt with significant weakness with  Bil hip abduction and extension.  Pt demonstrates hypomobility in lumbar spine with CPAs and L UPAs.  He presents with limited trunk AROM into L rotation and reports increased pain with repeated flexion.  Pt additionally with positive FABER on the R and positive FADDIR Bil, suggesting possible intra-articular pathology which could possibly be having an affect on his low back pain.  Pt demonstrates significant Bil hamstring tightness with L more significant than the R.  Additional assessment will be completed at next session and interventions initiated.  Pt will benefit from skilled PT interventions to address the deficits documented above for decreased back pain and improved QOL.     History and Personal Factors relevant to plan of care:  (+) pt is motivated to decrease pain  (-) chronicity of symptoms, pt's depression and decreased motivation    Clinical Presentation  Stable    Clinical Presentation due to:  Pt's pain is chronic and has remained unchanged since he was a teenager.  Pain is predictable.    Clinical Decision Making  Low    Rehab Potential  Fair    PT Frequency  2x / week    PT Duration  6 weeks    PT Treatment/Interventions  ADLs/Self Care Home Management;Aquatic Therapy;Biofeedback;Cryotherapy;Canalith Repostioning;Electrical Stimulation;Iontophoresis 4mg /ml Dexamethasone;Moist Heat;Traction;Ultrasound;DME Instruction;Gait training;Stair training;Functional mobility training;Therapeutic activities;Therapeutic exercise;Balance training;Neuromuscular re-education;Patient/family education;Manual techniques;Passive range of motion;Dry needling;Energy conservation;Splinting;Taping    PT Next Visit Plan  have pt complete mODI, complete full evaluation as this evaluation limited due to  pt arriving late    PT Home Exercise Plan  none at this time    Recommended Other Services  none at this time    Consulted and Agree with Plan of Care  Patient       Patient will benefit from skilled therapeutic intervention in order to improve the following deficits and impairments:  Abnormal gait, Decreased activity tolerance, Decreased balance, Decreased endurance, Decreased mobility, Decreased range of motion, Decreased strength, Hypomobility, Increased fascial restricitons, Increased muscle spasms, Impaired perceived functional ability, Impaired flexibility, Improper body mechanics, Postural dysfunction, Pain  Visit Diagnosis: Chronic midline low back pain without sciatica  Abnormal posture  Muscle weakness (generalized)     Problem List Patient Active Problem List   Diagnosis Date Noted  . Inflamed external hemorrhoid 06/10/2017  . Grief reaction 10/26/2016  . Fatty liver 05/20/2015  . History of fusion of cervical spine 05/20/2015  . Diarrhea, unspecified 04/21/2015  . Essential hypertension 08/28/2014  . NOCTURIA 01/08/2009  . OBESITY 05/12/2008  . Diabetes mellitus type 2, controlled ([place]) 12/12/2007  . HYPERCHOLESTEROLEMIA 10/23/2007  . Major depressive disorder, recurrent episode with anxious distress ([place]) 10/23/2007  . 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 GERD 10/23/2007  . DDD (degenerative disc disease), lumbar 10/23/2007    Collie Siad PT, DPT 08/25/2017, 12:48 PM  [facility] [address], [place], [address] Phone: [phone]   Fax:  [phone]  Name: SWAN ZAYED MRN: [id number] Date of Birth: 07/19/1941

## 2017-08-29 ENCOUNTER — Ambulatory Visit: Payer: BLUE CROSS/BLUE SHIELD

## 2017-08-31 ENCOUNTER — Ambulatory Visit: Payer: BLUE CROSS/BLUE SHIELD

## 2017-08-31 DIAGNOSIS — R293 Abnormal posture: Secondary | ICD-10-CM

## 2017-08-31 DIAGNOSIS — M6281 Muscle weakness (generalized): Secondary | ICD-10-CM

## 2017-08-31 DIAGNOSIS — G8929 Other chronic pain: Secondary | ICD-10-CM

## 2017-08-31 DIAGNOSIS — M545 Low back pain: Principal | ICD-10-CM

## 2017-08-31 NOTE — Patient Instructions (Signed)
  Seated hip extension.   Sitting on a chair   Squeeze your rear end muscles and press your left foot onto the floor    Count out loud for 5 seconds.    Repeat 10 times.   Perform 3 sets daily.    Pain-free level of effort. If symptoms increase, try the right side.

## 2017-08-31 NOTE — Therapy (Signed)
[facility] [address], [place], [address] Phone: [phone]   Fax:  [phone]  Physical Therapy Treatment  Patient Details  Name: Austin Bray MRN: [id number] Date of Birth: 09/23/1940 Referring Provider: Leone Haven, MD   Encounter Date: 08/31/2017  PT End of Session - 08/31/17 1525    Visit Number  2    Number of Visits  13    Date for PT Re-Evaluation  10/05/17    PT Start Time  1525    PT Stop Time  1612    PT Time Calculation (min)  47 min    Activity Tolerance  Patient tolerated treatment well    Behavior During Therapy  [facility] for tasks assessed/performed       Past Medical History:  Diagnosis Date  . Chronic kidney disease    stones 30 years ago  . Depression   . Diabetes mellitus without complication ([place])   . GERD (gastroesophageal reflux disease)   . Hyperlipidemia   . Hypertension     Past Surgical History:  Procedure Laterality Date  . ANTERIOR CERVICAL DISCECTOMY  2015  . TONSILLECTOMY      There were no vitals filed for this visit.  Subjective Assessment - 08/31/17 1527    Subjective  Back pain: 3/10 curently. Feels something if he moves around a little. Keeps from doing stuff. 7/10 at most for the past 3 months.     Pertinent History  Very limited evaluation as pt went to the wrong clinic and arrived late. Pt reports he has had central low back pain since he was a teenager.  Had PT 15 years ago for low back pain with some success.  Aggravating factors: bending over, rising from bending, standing in one spot.  No issues with sitting, especially if he is reclined.  Easing factors: heat, therapy in the past.  Pt's wife passed away ~ 1 year ago (pt tearful and visibly upset about this, MD has made referral to therapist per chart review).  He says this is why he has not seen anyone about his back.  Current pain: 1/10, best pain: 1/10, worst pain 5/10.  Pt likes to play the  guitar and to sing.  Pt had an epidural 3x in the past which were all ~15 years ago.  The first time it helped, the following time he did not have relief.  Pain does not affect his sleep.  Pt sleeps in his recliner.  Pt is also having occasional pain in lower cervical region if he maintains cervical rotation.   Pt had ACDF ~4-5 years ago. Pt was scheduled to have lower back surgery 15 years ago and was going to have what he believes was a fusion but pt decided not to go forward with this as he did not trust the MD.  Pt denies any surgeries in low back.  Pt has had chiropractor work ~10 years ago which did help. Pt enjoys hunting, has not done any this year due to not feeling motivated. Pt with known h/o diarrhea every 1-2 weeks. No numbness or tingling, weakness, loss of bowel or bladder function, or saddle anesthesia.  No radiation.  Pt with h/o lumbar DDD. MRI on 08/09/17: Mild osteophytic changes are noted most prominent at L3-4 with associated disc space narrowing. Mild degenerative retrolisthesis of L3 on L4 is noted. Mild degenerative anterolisthesis of L5 with respect S1 is noted.     Limitations  Standing  How long can you sit comfortably?  no issues    How long can you stand comfortably?  3 minutes    How long can you walk comfortably?  no issues    Diagnostic tests  see above    Patient Stated Goals  decrease pain    Currently in Pain?  Yes    Pain Score  3     Pain Onset  More than a month ago         [facility] PT Assessment - 08/31/17 1923      Observation/Other Assessments   Observations  Supine long sit test movement: L LE longer in long sit position.       Posture/Postural Control   Posture Comments  L low back side bend until around L1/2, then R side bend to around T11/12, then L side bend upwards. Decreased lordosis. Decreased bilateral hip extension. Slight R lateral shift.                           PT Education - 08/31/17 1926    Education provided  Yes     Education Details  ther-ex, HEP    Person(s) Educated  Patient    Methods  Explanation;Demonstration;Tactile cues;Verbal cues;Handout    Comprehension  Returned demonstration;Verbalized understanding     Objectives  Pt states using SPC on L side for balance.   Pain location: lumbar spine.   Posture: L low back side bend until around L1/2, then R side bent to around T11/T12, then L side bend upwards. Decreasd lordisis. Decreased bilateral hip extension  Slight R lateral shift.   Aggravating factors: bending over, laying flat on his back.    Relieving factors: heat  Pt states only having hx of skin cancer   Ther-ex  Sitting with lumbar towel roll 2 min.    Supine to long sitting 1x  L LE longer in long sit position   Seated L hip extension isometrics 10x5 seconds for 2 sets. No back pain afterwards or before exercise. HEP.   Sitting anterior/posterior pelvic tilts 10x2. Back twinges at first which eases with repetitions.   Standing R lateral shift correction 5x5 with proper lumbar positioning.   Forward step up onto 3 inch with L LE and R UE assist 10x2 with emphasis on glute max use  Improved exercise technique, movement at target joints, use of target muscles after mod verbal, visual, tactile cues.   Time spent to continue assessment. Worked on lumbar and thoracic mobility, posture as well as glute strengthening to help decrease back pain. Pt demonstrates possible anterior nutation of L pelvis secondary to L LE longer in long sit position observed. Worked on L glute max strengthening to help address. Pt tolerated session well without aggravation of symptoms.          PT Short Term Goals - 08/25/17 1238      PT SHORT TERM GOAL #1   Title  Pt will be independently completing HEP at least 4 days/wk for improved carryover between sessions.    Time  2    Period  Weeks    Status  New        PT Long Term Goals - 08/25/17 1239      PT LONG TERM GOAL #1   Title   Pt will improve Bil hip Abd and E strength to at least 4/5 for improved strength and support to low back    Baseline  L,R  hip Abd: 3/5, 3/5.  L,R hip E: 3/5, 3-/5    Time  6    Period  Weeks    Status  New      PT LONG TERM GOAL #2   Title  Pt's Bil hamstring length will improve to at least 20 deg Bil to decrease pull and stress on lower back    Baseline  lacking 40 deg on LLE, lacking 26 deg on RLE    Time  4    Period  Weeks    Status  New      PT LONG TERM GOAL #3   Title  Pt's worst pain will decrease to at least 2/10 for improved QOL    Baseline  5/10    Time  3    Period  Weeks    Status  New            Plan - 08/31/17 1926    Clinical Impression Statement  Time spent to continue assessment. Worked on lumbar and thoracic mobility, posture as well as glute strengthening to help decrease back pain. Pt demonstrates possible anterior nutation of L pelvis secondary to L LE longer in long sit position observed. Worked on L glute max strengthening to help address. Pt tolerated session well without aggravation of symptoms.     Rehab Potential  Fair    PT Frequency  2x / week    PT Duration  6 weeks    PT Treatment/Interventions  ADLs/Self Care Home Management;Aquatic Therapy;Biofeedback;Cryotherapy;Canalith Repostioning;Electrical Stimulation;Iontophoresis 4mg /ml Dexamethasone;Moist Heat;Traction;Ultrasound;DME Instruction;Gait training;Stair training;Functional mobility training;Therapeutic activities;Therapeutic exercise;Balance training;Neuromuscular re-education;Patient/family education;Manual techniques;Passive range of motion;Dry needling;Energy conservation;Splinting;Taping    PT Next Visit Plan  have pt complete mODI, complete full evaluation as this evaluation limited due to pt arriving late    PT Home Exercise Plan  none at this time    Consulted and Agree with Plan of Care  Patient       Patient will benefit from skilled therapeutic intervention in order to improve the  following deficits and impairments:  Abnormal gait, Decreased activity tolerance, Decreased balance, Decreased endurance, Decreased mobility, Decreased range of motion, Decreased strength, Hypomobility, Increased fascial restricitons, Increased muscle spasms, Impaired perceived functional ability, Impaired flexibility, Improper body mechanics, Postural dysfunction, Pain  Visit Diagnosis: Chronic midline low back pain without sciatica  Abnormal posture  Muscle weakness (generalized)     Problem List Patient Active Problem List   Diagnosis Date Noted  . Inflamed external hemorrhoid 06/10/2017  . Grief reaction 10/26/2016  . Fatty liver 05/20/2015  . History of fusion of cervical spine 05/20/2015  . Diarrhea, unspecified 04/21/2015  . Essential hypertension 08/28/2014  . NOCTURIA 01/08/2009  . OBESITY 05/12/2008  . Diabetes mellitus type 2, controlled ([place]) 12/12/2007  . HYPERCHOLESTEROLEMIA 10/23/2007  . Major depressive disorder, recurrent episode with anxious distress ([place]) 10/23/2007  . 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 GERD 10/23/2007  . DDD (degenerative disc disease), lumbar 10/23/2007   Joneen Boers PT, DPT   08/31/2017, 7:35 PM  [facility] [address] [place], [address] Phone: [phone]   Fax:  [phone]  Name: Austin Bray MRN: [id number] Date of Birth: Dec 02, 1940

## 2017-09-05 ENCOUNTER — Ambulatory Visit: Payer: BLUE CROSS/BLUE SHIELD

## 2017-09-07 ENCOUNTER — Ambulatory Visit: Payer: BLUE CROSS/BLUE SHIELD

## 2017-09-12 ENCOUNTER — Ambulatory Visit: Payer: BLUE CROSS/BLUE SHIELD

## 2017-09-14 ENCOUNTER — Ambulatory Visit: Payer: BLUE CROSS/BLUE SHIELD

## 2017-09-19 ENCOUNTER — Ambulatory Visit: Payer: Medicare Other

## 2017-09-22 ENCOUNTER — Ambulatory Visit: Payer: Medicare Other

## 2017-12-22 ENCOUNTER — Ambulatory Visit (INDEPENDENT_AMBULATORY_CARE_PROVIDER_SITE_OTHER): Payer: BLUE CROSS/BLUE SHIELD | Admitting: Family

## 2017-12-22 ENCOUNTER — Ambulatory Visit: Payer: BLUE CROSS/BLUE SHIELD

## 2017-12-22 VITALS — BP 128/68 | HR 90 | Temp 98.5°F | Resp 14 | Ht 68.0 in | Wt 209.8 lb

## 2017-12-22 DIAGNOSIS — Z0000 Encounter for general adult medical examination without abnormal findings: Secondary | ICD-10-CM

## 2017-12-22 NOTE — Progress Notes (Signed)
Subjective:   Austin Bray is a 77 y.o. male who presents for Medicare Annual/Subsequent preventive examination.  Review of Systems:  No ROS.  Medicare Wellness Visit. Additional risk factors are reflected in the social history.  Cardiac Risk Factors include: advanced age (>22men, >2 women);male gender;hypertension;diabetes mellitus     Objective:    Vitals: BP 128/68 (BP Location: Left Arm, Patient Position: Sitting, Cuff Size: Normal)   Pulse 90   Temp 98.5 F (36.9 C)   Resp 14   Ht 5\' 8"  (1.727 m)   Wt 209 lb 12.8 oz (95.2 kg)   SpO2 97%   BMI 31.90 kg/m   Body mass index is 31.9 kg/m.  Advanced Directives 12/22/2017 08/24/2017 12/21/2016  Does Patient Have a Medical Advance Directive? No No No  Would patient like information on creating a medical advance directive? Yes (MAU/Ambulatory/Procedural Areas - Information given) Yes (MAU/Ambulatory/Procedural Areas - Information given) Yes (MAU/Ambulatory/Procedural Areas - Information given)    Tobacco Social History   Tobacco Use  Smoking Status Former Smoker  Smokeless Tobacco Never Used     Counseling given: Not Answered   Clinical Intake:  Pre-visit preparation completed: Yes  Pain : No/denies pain     Nutritional Status: BMI > 30  Obese Diabetes: Yes(Followed by pcp)  How often do you need to have someone help you when you read instructions, pamphlets, or other written materials from your doctor or pharmacy?: 1 - Never  Interpreter Needed?: No     Past Medical History:  Diagnosis Date  . Chronic kidney disease    stones 30 years ago  . Depression   . Diabetes mellitus without complication ([place])   . GERD (gastroesophageal reflux disease)   . Hyperlipidemia   . Hypertension    Past Surgical History:  Procedure Laterality Date  . ANTERIOR CERVICAL DISCECTOMY  2015  . TONSILLECTOMY     Family History  Problem Relation Age of Onset  . Dementia Mother   . Hypertension Father   .  Hyperlipidemia Father   . Cancer Maternal Grandmother   . Dementia Maternal Grandfather   . Cancer Paternal Grandmother        colon  . Heart disease Paternal Grandfather    Social History   Socioeconomic History  . Marital status: Widowed    Spouse name: Not on file  . Number of children: Not on file  . Years of education: Not on file  . Highest education level: Not on file  Occupational History  . Not on file  Social Needs  . Financial resource strain: Not hard at all  . Food insecurity:    Worry: Never true    Inability: Never true  . Transportation needs:    Medical: No    Non-medical: No  Tobacco Use  . Smoking status: Former Research scientist (life sciences)  . Smokeless tobacco: Never Used  Substance and Sexual Activity  . Alcohol use: Yes    Alcohol/week: 6.0 oz    Types: 10 Shots of liquor per week    Comment: weekly  . Drug use: No  . Sexual activity: Never  Lifestyle  . Physical activity:    Days per week: Not on file    Minutes per session: Not on file  . Stress: Only a little  Relationships  . Social connections:    Talks on phone: Not on file    Gets together: Not on file    Attends religious service: Not on file    Active  member of club or organization: Not on file    Attends meetings of clubs or organizations: Not on file    Relationship status: Not on file  Other Topics Concern  . Not on file  Social History Narrative  . Not on file    Outpatient Encounter Medications as of 12/22/2017  Medication Sig  . omeprazole (PRILOSEC) 20 MG capsule TAKE 1 CAPSULE DAILY  . sertraline (ZOLOFT) 100 MG tablet Take 1 tablet (100 mg total) by mouth daily.  . simvastatin (ZOCOR) 40 MG tablet Take 1 tablet (40 mg total) by mouth daily at 6 PM. Take one by mouth daily.  . traZODone (DESYREL) 150 MG tablet TAKE 2 TABLETS AT BEDTIME AT NIGHT  . ALPRAZolam (XANAX) 0.25 MG tablet Take 1 tablet (0.25 mg total) by mouth 2 (two) times daily as needed for anxiety. (Patient not taking: Reported  on 12/22/2017)  . glucose blood test strip Use as instructed to check Blood glucose up to three times a day. Dispense contour plus strips.E11.9 (Patient not taking: Reported on 12/22/2017)   No facility-administered encounter medications on file as of 12/22/2017.     Activities of Daily Living In your present state of health, do you have any difficulty performing the following activities: 12/22/2017  Hearing? N  Vision? N  Difficulty concentrating or making decisions? N  Walking or climbing stairs? N  Dressing or bathing? N  Doing errands, shopping? N  Preparing Food and eating ? N  Using the Toilet? N  In the past six months, have you accidently leaked urine? N  Do you have problems with loss of bowel control? N  Managing your Medications? N  Managing your Finances? N  Housekeeping or managing your Housekeeping? N  Some recent data might be hidden    Patient Care Team: Leone Haven, MD as PCP - General (Family Medicine) Bary Castilla, Forest Gleason, MD (General Surgery) Crecencio Mc, MD (Internal Medicine)   Assessment:   This is a routine wellness examination for Austin Bray.  The goal of the wellness visit is to assist the patient how to close the gaps in care and create a preventative care plan for the patient.   The roster of all physicians providing medical care to patient is listed in the Snapshot section of the chart.  Osteoporosis risk reviewed.    Safety issues reviewed; Smoke and carbon monoxide detectors in the home. No firearms in the home. Wears seatbelts when driving or riding with others. No violence in the home.  They do not have excessive sun exposure.  Discussed the need for sun protection: hats, long sleeves and the use of sunscreen if there is significant sun exposure.  Patient is alert, normal appearance, oriented to person/place/and time.  Correctly identified the president of the [place] and recalls of 2/3 words. Performs simple calculations and can read correct  time from watch face. Displays appropriate judgement.  No new identified risk were noted.  No failures at ADL's or IADL's.  Ambulates with cane.   BMI- discussed the importance of a healthy diet, water intake and the benefits of aerobic exercise. Educational material provided.   24 hour diet recall: Regular diet  Dental- every 12 months.  Eye- He plans to schedule an eye exam.   Sleep patterns- Sleeps without issues when taking trazodone as prescribed.  Diabetes- He does not check his blood sugar.  States he watches his diet somewhat and will put forth an effort to make healthy choices while monitoring his  portion control. States he has sensation to touch in both feet.   Pneumococcal discussed.    Exercise Activities and Dietary recommendations Current Exercise Habits: The patient does not participate in regular exercise at present  Goals    . Increase physical activity     Stretch Cardio exercise 30 minutes daily       Fall Risk Fall Risk  12/22/2017 12/21/2016 10/29/2016 04/21/2015  Falls in the past year? No No No No   Depression Screen PHQ 2/9 Scores 12/22/2017 12/21/2016 10/29/2016 12/11/2015  PHQ - 2 Score 1 3 0 2  PHQ- 9 Score - 9 - 5    Cognitive Function MMSE - Mini Mental State Exam 12/22/2017 12/21/2016  Orientation to time 5 5  Orientation to Place 5 5  Registration 3 3  Attention/ Calculation 5 5  Recall 2 3  Language- name 2 objects 2 2  Language- repeat 1 1  Language- follow 3 step command 3 3  Language- read & follow direction 1 1  Write a sentence 1 1  Copy design 1 1  Total score 29 30        Immunization History  Administered Date(s) Administered  . Hep A / Hep B 05/27/2015  . Influenza Whole 04/24/2009  . Influenza, High Dose Seasonal PF 07/04/2017  . Influenza,inj,Quad PF,6+ Mos 05/17/2014, 05/27/2015   Screening Tests Health Maintenance  Topic Date Due  . TETANUS/TDAP  07/31/1959  . PNA vac Low Risk Adult (1 of 2 - PCV13) 07/30/2005  .  URINE MICROALBUMIN  02/27/2011  . FOOT EXAM  08/29/2015  . OPHTHALMOLOGY EXAM  05/19/2016  . HEMOGLOBIN A1C  01/02/2018  . INFLUENZA VACCINE  02/16/2018       Plan:    End of life planning; Advance aging; Advanced directives discussed. Copy of current HCPOA/Living Will once requested once completed.    I have personally reviewed and noted the following in the patient's chart:   . Medical and social history . Use of alcohol, tobacco or illicit drugs  . Current medications and supplements . Functional ability and status . Nutritional status . Physical activity . Advanced directives . List of other physicians . Hospitalizations, surgeries, and ER visits in previous 12 months . Vitals . Screenings to include cognitive, depression, and falls . Referrals and appointments  In addition, I have reviewed and discussed with patient certain preventive protocols, quality metrics, and best practice recommendations. A written personalized care plan for preventive services as well as general preventive health recommendations were provided to patient.     Varney Biles, LPN  09/19/74

## 2017-12-22 NOTE — Patient Instructions (Addendum)
  Austin Bray , Thank you for taking time to come for your Medicare Wellness Visit. I appreciate your ongoing commitment to your health goals. Please review the following plan we discussed and let me know if I can assist you in the future.   Follow up as needed.    Bring a copy of your [place] and/or Living Will to be scanned into chart.  Call [facility] to schedule an eye exam   Have a great day!  These are the goals we discussed: Goals    . Increase physical activity     Stretch Cardio exercise 30 minutes daily       This is a list of the screening recommended for you and due dates:  Health Maintenance  Topic Date Due  . Tetanus Vaccine  07/31/1959  . Pneumonia vaccines (1 of 2 - PCV13) 07/30/2005  . Urine Protein Check  02/27/2011  . Complete foot exam   08/29/2015  . Eye exam for diabetics  05/19/2016  . Hemoglobin A1C  01/02/2018  . Flu Shot  02/16/2018

## 2017-12-22 NOTE — Progress Notes (Signed)
   Subjective:    Patient ID: Austin Bray, male    DOB: 21-Jul-1940, 77 y.o.   MRN: [id number]  HPI     Review of Systems     Objective:   Physical Exam        Assessment & Plan:

## 2017-12-26 ENCOUNTER — Encounter: Payer: Self-pay | Admitting: Gastroenterology

## 2017-12-26 ENCOUNTER — Ambulatory Visit (INDEPENDENT_AMBULATORY_CARE_PROVIDER_SITE_OTHER): Payer: BLUE CROSS/BLUE SHIELD | Admitting: Family Medicine

## 2017-12-26 ENCOUNTER — Encounter: Payer: Self-pay | Admitting: Family Medicine

## 2017-12-26 VITALS — BP 150/100 | HR 90 | Temp 98.8°F | Ht 68.0 in | Wt 207.4 lb

## 2017-12-26 DIAGNOSIS — K649 Unspecified hemorrhoids: Secondary | ICD-10-CM | POA: Diagnosis not present

## 2017-12-26 DIAGNOSIS — I10 Essential (primary) hypertension: Secondary | ICD-10-CM

## 2017-12-26 DIAGNOSIS — F339 Major depressive disorder, recurrent, unspecified: Secondary | ICD-10-CM | POA: Diagnosis not present

## 2017-12-26 DIAGNOSIS — E7800 Pure hypercholesterolemia, unspecified: Secondary | ICD-10-CM | POA: Diagnosis not present

## 2017-12-26 DIAGNOSIS — E119 Type 2 diabetes mellitus without complications: Secondary | ICD-10-CM | POA: Diagnosis not present

## 2017-12-26 DIAGNOSIS — L570 Actinic keratosis: Secondary | ICD-10-CM | POA: Diagnosis not present

## 2017-12-26 LAB — COMPREHENSIVE METABOLIC PANEL
ALK PHOS: 54 U/L (ref 39–117)
ALT: 27 U/L (ref 0–53)
AST: 27 U/L (ref 0–37)
Albumin: 4.7 g/dL (ref 3.5–5.2)
BILIRUBIN TOTAL: 0.5 mg/dL (ref 0.2–1.2)
BUN: 17 mg/dL (ref 6–23)
CO2: 27 meq/L (ref 19–32)
Calcium: 9.8 mg/dL (ref 8.4–10.5)
Chloride: 101 mEq/L (ref 96–112)
Creatinine, Ser: 1.17 mg/dL (ref 0.40–1.50)
GFR: 64.18 mL/min (ref >60.00)
GLUCOSE: 203 mg/dL — AB (ref 70–99)
POTASSIUM: 4.7 meq/L (ref 3.5–5.1)
SODIUM: 139 meq/L (ref 135–145)
TOTAL PROTEIN: 7.5 g/dL (ref 6.0–8.3)

## 2017-12-26 LAB — MICROALBUMIN / CREATININE URINE RATIO
Creatinine,U: 202.3 mg/dL
Microalb Creat Ratio: 13.1 mg/g (ref 0.0–30.0)
Microalb, Ur: 26.4 mg/dL — ABNORMAL HIGH (ref 0.0–1.9)

## 2017-12-26 LAB — LIPID PANEL
CHOL/HDL RATIO: 5
Cholesterol: 183 mg/dL (ref 0–200)
HDL: 39.2 mg/dL (ref >39.00)
LDL Cholesterol: 115 mg/dL — ABNORMAL HIGH (ref 0–99)
NONHDL: 144.01
Triglycerides: 147 mg/dL (ref 0.0–149.0)
VLDL: 29.4 mg/dL (ref 0.0–40.0)

## 2017-12-26 LAB — HEMOGLOBIN A1C: HEMOGLOBIN A1C: 8.7 % — AB (ref 4.6–6.5)

## 2017-12-26 MED ORDER — SIMVASTATIN 40 MG PO TABS: 40.0000 mg | ORAL_TABLET | Freq: Every day | ORAL | 0 refills | Status: DC

## 2017-12-26 MED ORDER — SERTRALINE HCL 100 MG PO TABS: 100.0000 mg | ORAL_TABLET | Freq: Every day | ORAL | 0 refills | Status: DC

## 2017-12-26 MED ORDER — TRAZODONE HCL 150 MG PO TABS: ORAL_TABLET | ORAL | 0 refills | Status: AC

## 2017-12-26 MED ORDER — TRAZODONE HCL 150 MG PO TABS: ORAL_TABLET | ORAL | 1 refills | Status: DC

## 2017-12-26 MED ORDER — SERTRALINE HCL 100 MG PO TABS: 100.0000 mg | ORAL_TABLET | Freq: Every day | ORAL | 2 refills | Status: DC

## 2017-12-26 MED ORDER — SIMVASTATIN 40 MG PO TABS: 40.0000 mg | ORAL_TABLET | Freq: Every day | ORAL | 3 refills | Status: DC

## 2017-12-26 NOTE — Assessment & Plan Note (Addendum)
Has been well controlled though elevated today.  He will check at home and contact us in 2 weeks with readings.

## 2017-12-26 NOTE — Assessment & Plan Note (Signed)
Refer to GI for treatment and evaluation.

## 2017-12-26 NOTE — Patient Instructions (Signed)
Nice to see you. We will get you to see dermatology and GI. Please monitor the depression and if it worsens please be evaluated.  If you develop thoughts of hurting yourself please get looked at immediately. We will check lab work today and contact you with the results.

## 2017-12-26 NOTE — Assessment & Plan Note (Signed)
Refer to dermatology 

## 2017-12-26 NOTE — Assessment & Plan Note (Signed)
He will work on diet and exercise.  Check A1c today.

## 2017-12-26 NOTE — Assessment & Plan Note (Addendum)
Well-controlled currently.  Refills of medications given.  Return precautions in AVS.

## 2017-12-26 NOTE — Progress Notes (Signed)
Tommi Rumps, MD Phone: [phone]  Austin Bray is a 77 y.o. male who presents today for f/u.  CC: htn, dm, hemorrhoids  HYPERTENSION  Disease Monitoring  Home BP Monitoring controlled at prior visit Chest pain- no    Dyspnea- no Medications  Compliance-  Taking no medications  Edema- no  Diabetes: Not checking sugars.  No polyuria or polydipsia.  He does drink 2 L of Diet Coke daily.  Not eating as many sweets.  Does eat a fair number of carbs.  He is going to start biking for exercise.  Does report occasional itching near his ankles.  No rash.  Depression: Notes this is been good.  He ran out of his Zoloft a couple of days ago.  He has been taking trazodone as well.  He is been on the trazodone for 40 to 50 years.  He is getting more active.  He notes no SI.  He never saw the therapist.  Hemorrhoids: He reports intermittent issues with those.  Bleeding at times.  Burning and bulging and itching at times.  Uses Preparation H and something else topical.  Sits on a pillow.  Did not see GI yet.  He reports rough spots on his scalp that is been present for a number of months.  He would like to see dermatology.    Social History   Tobacco Use  Smoking Status Former Smoker  Smokeless Tobacco Never Used     ROS see history of present illness  Objective  Physical Exam Vitals:   12/26/17 1051  BP: (!) 150/100  Pulse: 90  Temp: 98.8 F (37.1 C)  SpO2: 96%    BP Readings from Last 3 Encounters:  12/26/17 (!) 150/100  12/22/17 128/68  08/09/17 140/82   Wt Readings from Last 3 Encounters:  12/26/17 207 lb 6.4 oz (94.1 kg)  12/22/17 209 lb 12.8 oz (95.2 kg)  08/09/17 208 lb 3.2 oz (94.4 kg)    Physical Exam  Constitutional: No distress.  Cardiovascular: Normal rate, regular rhythm and normal heart sounds.  Pulmonary/Chest: Effort normal and breath sounds normal.  Genitourinary:  Genitourinary Comments: External skin tags noted on rectal, no irritated  hemorrhoids noted  Musculoskeletal: He exhibits no edema.  Neurological: He is alert.  Skin: Skin is warm and dry. He is not diaphoretic.  Rough scaly spots on his scalp   Diabetic Foot Exam - Simple   Simple Foot Form Diabetic Foot exam was performed with the following findings:  Yes 12/26/2017 11:32 AM  Visual Inspection No deformities, no ulcerations, no other skin breakdown bilaterally:  Yes Sensation Testing Intact to touch and monofilament testing bilaterally:  Yes Pulse Check Posterior Tibialis and Dorsalis pulse intact bilaterally:  Yes Comments      Assessment/Plan: Please see individual problem list.  Hemorrhoids Refer to GI for treatment and evaluation.  Diabetes mellitus type 2, controlled He will work on diet and exercise.  Check A1c today.  Actinic keratoses Refer to dermatology.  Essential hypertension Has been well controlled though elevated today.  He will check at home and contact us in 2 weeks with readings.  Major depressive disorder, recurrent episode with anxious distress ([place]) Well-controlled currently.  Refills of medications given.  Return precautions in AVS.   Health maintenance: Patient is due for eye exam.  He had one scheduled today.  He will have them send [place] his records.  Orders Placed This Encounter  Procedures  . HgB A1c  . Lipid panel  .  Comp Met (CMET)  . Urine Microalbumin w/creat. ratio  . Ambulatory referral to Dermatology    Referral Priority:   Routine    Referral Type:   Consultation    Referral Reason:   Specialty Services Required    Requested Specialty:   Dermatology    Number of Visits Requested:   1  . Ambulatory referral to Gastroenterology    Referral Priority:   Routine    Referral Type:   Consultation    Referral Reason:   Specialty Services Required    Number of Visits Requested:   1    Meds ordered this encounter  Medications  . DISCONTD: sertraline (ZOLOFT) 100 MG tablet    Sig: Take 1 tablet (100 mg  total) by mouth daily.    Dispense:  90 tablet    Refill:  2  . DISCONTD: simvastatin (ZOCOR) 40 MG tablet    Sig: Take 1 tablet (40 mg total) by mouth daily at 6 PM. Take one by mouth daily.    Dispense:  90 tablet    Refill:  3  . DISCONTD: traZODone (DESYREL) 150 MG tablet    Sig: TAKE 2 TABLETS AT BEDTIME AT NIGHT    Dispense:  180 tablet    Refill:  1  . sertraline (ZOLOFT) 100 MG tablet    Sig: Take 1 tablet (100 mg total) by mouth daily.    Dispense:  30 tablet    Refill:  0  . simvastatin (ZOCOR) 40 MG tablet    Sig: Take 1 tablet (40 mg total) by mouth daily at 6 PM. Take one by mouth daily.    Dispense:  30 tablet    Refill:  0  . traZODone (DESYREL) 150 MG tablet    Sig: TAKE 2 TABLETS AT BEDTIME AT NIGHT    Dispense:  60 tablet    Refill:  0     Tommi Rumps, MD [place]

## 2017-12-29 ENCOUNTER — Other Ambulatory Visit: Payer: Self-pay | Admitting: Family Medicine

## 2017-12-29 DIAGNOSIS — I10 Essential (primary) hypertension: Secondary | ICD-10-CM

## 2017-12-29 MED ORDER — EMPAGLIFLOZIN 10 MG PO TABS: 10.0000 mg | ORAL_TABLET | Freq: Every day | ORAL | 3 refills | Status: DC

## 2017-12-29 MED ORDER — LISINOPRIL 5 MG PO TABS: 5.0000 mg | ORAL_TABLET | Freq: Every day | ORAL | 0 refills | Status: DC

## 2017-12-29 MED ORDER — LISINOPRIL 5 MG PO TABS: 5.0000 mg | ORAL_TABLET | Freq: Every day | ORAL | 1 refills | Status: DC

## 2017-12-29 MED ORDER — ROSUVASTATIN CALCIUM 20 MG PO TABS: 20.0000 mg | ORAL_TABLET | Freq: Every day | ORAL | 1 refills | Status: DC

## 2017-12-29 MED ORDER — ROSUVASTATIN CALCIUM 20 MG PO TABS: 20.0000 mg | ORAL_TABLET | Freq: Every day | ORAL | 0 refills | Status: DC

## 2018-01-10 ENCOUNTER — Telehealth: Payer: Self-pay

## 2018-01-10 NOTE — Telephone Encounter (Signed)
Copied from [place] [phone]. Topic: General - Other >> Jan 10, 2018 12:57 PM Keene Breath wrote: Reason for CRM: Patient called and stated that he got a call from [facility] saying that they his medication is no longer being manufactured by the company listed.  He thinks the medication is for traZODone (DESYREL) 150 MG tablet, but is not sure.  They advised him to call his doctor to let the office know so that he could get another medication.  Pt's CB# [phone].  [facility] - [place], [place] - [address] [phone] (Phone) [phone] (Fax)

## 2018-01-10 NOTE — Telephone Encounter (Signed)
Please advise 

## 2018-01-11 NOTE — Telephone Encounter (Signed)
Left message to return call, ok for pec to speak to patient about message below 

## 2018-01-11 NOTE — Telephone Encounter (Signed)
[facility] is stating that trazodone is manufactured by [employer] and they stopped manufacturing this. Patient uses this manufacturer with [facility]  because he used to work for that [employer] so it was no cost but [employer] will have a cost. The new cost would be $10 for 90 days and $8 for 30 days supply. One option to switch per [facility] would be: Zolpidem tartrate 10 mg tablets would be $1.84 90 day supply

## 2018-01-11 NOTE — Telephone Encounter (Signed)
Please contact [facility] to confirm the involved medication. It looks like his trazodone was most recently sent to [facility]. I need to know the exact medication to be able to figure out a solution. Thanks.

## 2018-01-11 NOTE — Telephone Encounter (Signed)
Given his age Trazodone is the preferred option. We can send a 90 day supply in for him. Please let the patient know that they still have the medication, but it will be from a different manufacturer and the cost for a 90 day supply will be $10. Thanks.

## 2018-01-20 ENCOUNTER — Other Ambulatory Visit: Payer: Self-pay | Admitting: Family Medicine

## 2018-01-23 ENCOUNTER — Other Ambulatory Visit: Payer: Self-pay | Admitting: Family Medicine

## 2018-01-23 NOTE — Telephone Encounter (Signed)
Patient did not return call.

## 2018-02-01 ENCOUNTER — Other Ambulatory Visit (INDEPENDENT_AMBULATORY_CARE_PROVIDER_SITE_OTHER): Payer: BLUE CROSS/BLUE SHIELD

## 2018-02-01 DIAGNOSIS — I10 Essential (primary) hypertension: Secondary | ICD-10-CM | POA: Diagnosis not present

## 2018-02-01 DIAGNOSIS — E785 Hyperlipidemia, unspecified: Secondary | ICD-10-CM

## 2018-02-01 LAB — BASIC METABOLIC PANEL WITH GFR
BUN: 18 mg/dL (ref 6–23)
CO2: 27 meq/L (ref 19–32)
Calcium: 9.5 mg/dL (ref 8.4–10.5)
Chloride: 101 meq/L (ref 96–112)
Creatinine, Ser: 1.18 mg/dL (ref 0.40–1.50)
GFR: 63.54 mL/min (ref >60.00)
Glucose, Bld: 176 mg/dL — ABNORMAL HIGH (ref 70–99)
Potassium: 4.4 meq/L (ref 3.5–5.1)
Sodium: 138 meq/L (ref 135–145)

## 2018-02-01 LAB — HEPATIC FUNCTION PANEL
ALT: 27 U/L (ref 0–53)
AST: 22 U/L (ref 0–37)
Albumin: 4.6 g/dL (ref 3.5–5.2)
Alkaline Phosphatase: 41 U/L (ref 39–117)
Bilirubin, Direct: 0.1 mg/dL (ref 0.0–0.3)
Total Bilirubin: 0.6 mg/dL (ref 0.2–1.2)
Total Protein: 7.2 g/dL (ref 6.0–8.3)

## 2018-02-01 LAB — LDL CHOLESTEROL, DIRECT: Direct LDL: 61 mg/dL

## 2018-02-08 ENCOUNTER — Ambulatory Visit: Payer: BLUE CROSS/BLUE SHIELD | Admitting: Gastroenterology

## 2018-02-09 ENCOUNTER — Ambulatory Visit (INDEPENDENT_AMBULATORY_CARE_PROVIDER_SITE_OTHER): Payer: BLUE CROSS/BLUE SHIELD | Admitting: Gastroenterology

## 2018-02-09 ENCOUNTER — Encounter: Payer: Self-pay | Admitting: Gastroenterology

## 2018-02-09 ENCOUNTER — Other Ambulatory Visit: Payer: Self-pay

## 2018-02-09 VITALS — BP 134/85 | HR 90 | Ht 68.0 in | Wt 205.0 lb

## 2018-02-09 DIAGNOSIS — K644 Residual hemorrhoidal skin tags: Secondary | ICD-10-CM

## 2018-02-09 DIAGNOSIS — K625 Hemorrhage of anus and rectum: Secondary | ICD-10-CM | POA: Diagnosis not present

## 2018-02-09 DIAGNOSIS — R194 Change in bowel habit: Secondary | ICD-10-CM

## 2018-02-09 NOTE — Patient Instructions (Signed)
High-Fiber Diet  Fiber, also called dietary fiber, is a type of carbohydrate found in fruits, vegetables, whole grains, and beans. A high-fiber diet can have many health benefits. Your health care provider may recommend a high-fiber diet to help:  · Prevent constipation. Fiber can make your bowel movements more regular.  · Lower your cholesterol.  · Relieve hemorrhoids, uncomplicated diverticulosis, or irritable bowel syndrome.  · Prevent overeating as part of a weight-loss plan.  · Prevent heart disease, type 2 diabetes, and certain cancers.    What is my plan?  The recommended daily intake of fiber includes:  · 38 grams for men under age 50.  · 30 grams for men over age 50.  · 25 grams for women under age 50.  · 21 grams for women over age 50.    You can get the recommended daily intake of dietary fiber by eating a variety of fruits, vegetables, grains, and beans. Your health care provider may also recommend a fiber supplement if it is not possible to get enough fiber through your diet.  What do I need to know about a high-fiber diet?  · Fiber supplements have not been widely studied for their effectiveness, so it is better to get fiber through food sources.  · Always check the fiber content on the nutrition facts label of any prepackaged food. Look for foods that contain at least 5 grams of fiber per serving.  · Ask your dietitian if you have questions about specific foods that are related to your condition, especially if those foods are not listed in the following section.  · Increase your daily fiber consumption gradually. Increasing your intake of dietary fiber too quickly may cause bloating, cramping, or gas.  · Drink plenty of water. Water helps you to digest fiber.  What foods can I eat?  Grains  Whole-grain breads. Multigrain cereal. Oats and oatmeal. Brown rice. Barley. Bulgur wheat. Millet. Bran muffins. Popcorn. Rye wafer crackers.  Vegetables   Sweet potatoes. Spinach. Kale. Artichokes. Cabbage. Broccoli. Green peas. Carrots. Squash.  Fruits  Berries. Pears. Apples. Oranges. Avocados. Prunes and raisins. Dried figs.  Meats and Other Protein Sources  [place]y, kidney, pinto, and soy beans. Split peas. Lentils. Nuts and seeds.  Dairy  Fiber-fortified yogurt.  Beverages  Fiber-fortified soy milk. Fiber-fortified orange juice.  Other  Fiber bars.  The items listed above may not be a complete list of recommended foods or beverages. Contact your dietitian for more options.  What foods are not recommended?  Grains  White bread. Pasta made with refined flour. White rice.  Vegetables  Fried potatoes. Canned vegetables. Well-cooked vegetables.  Fruits  Fruit juice. Cooked, strained fruit.  Meats and Other Protein Sources  Fatty cuts of meat. Fried poultry or fried fish.  Dairy  Milk. Yogurt. Cream cheese. Sour cream.  Beverages  Soft drinks.  Other  Cakes and pastries. Butter and oils.  The items listed above may not be a complete list of foods and beverages to avoid. Contact your dietitian for more information.  What are some tips for including high-fiber foods in my diet?  · Eat a wide variety of high-fiber foods.  · Make sure that half of all grains consumed each day are whole grains.  · Replace breads and cereals made from refined flour or white flour with whole-grain breads and cereals.  · Replace white rice with brown rice, bulgur wheat, or millet.  · Start the day with a breakfast that is high in fiber,   such as a cereal that contains at least 5 grams of fiber per serving.  · Use beans in place of meat in soups, salads, or pasta.  · Eat high-fiber snacks, such as berries, raw vegetables, nuts, or popcorn.  This information is not intended to replace advice given to you by your health care provider. Make sure you discuss any questions you have with your health care provider.  Document Released: 07/05/2005 Document Revised: 12/11/2015 Document Reviewed: 12/18/2013   Elsevier Interactive Patient Education © 2018 [employer]

## 2018-02-09 NOTE — Progress Notes (Signed)
Austin Darby, MD [address]  [place]  [place], [place] [address]  Main: [phone]  Fax: [phone]    Gastroenterology Consultation  Referring Provider:     Leone Haven, MD Primary Care Physician:  Leone Haven, MD Primary Gastroenterologist:  Dr. Cephas Bray Reason for Consultation:     Rectal bleeding, symptomatic hemorrhoids        HPI:   Austin Bray is a 77 y.o. male referred by Dr. Caryl Bis, Angela Adam, MD  for consultation & management of rectal bleeding and symptomatic hemorrhoids. Patient with history of diabetes, hypertension, GERD, obesity presents with chronic history of rectal discomfort over several years, however, it got worse in last 6 months and uses pillow to sit. He also reports intermittent blood on wiping. Has been using Preparation H to relieve symptoms. In last 2 weeks, he has not experienced any symptoms related to hemorrhoids. He does report intermittent episodes of explosive bowel movement. He lost his wife about an year ago and has been grieving. They lived together for more than 50 years. He lives alone. He has been drinking diet soda up to 2 L per day, he finds it hard to drink water. His hemoglobin A1c has increased to 8.7 recently. He drinks alcohol 2 glasses of wine or hard liquor daily. He does not have much interest in any kind of activity since he lost his wife, he reports that he watches TV all day and sleeps on couch. He has a son who is in town and the other one lives in [place]   NSAIDs: none  Antiplts/Anticoagulants/Anti thrombotics: none  GI Procedures:  Colonoscopy 01/14/2009 His last colonoscopy was by [facility] endoscopy in 12/2008 and reportedly a polyp was removed. However pathology was inconclusive  Past Medical History:  Diagnosis Date  . Chronic kidney disease    stones 30 years ago  . Depression   . Diabetes mellitus without complication ([place])   . GERD (gastroesophageal reflux disease)   .  Hyperlipidemia   . Hypertension     Past Surgical History:  Procedure Laterality Date  . ANTERIOR CERVICAL DISCECTOMY  2015  . TONSILLECTOMY      Current Outpatient Medications:  .  ALPRAZolam (XANAX) 0.25 MG tablet, Take 1 tablet (0.25 mg total) by mouth 2 (two) times daily as needed for anxiety., Disp: 60 tablet, Rfl: 0 .  glucose blood test strip, Use as instructed to check Blood glucose up to three times a day. Dispense contour plus strips.E11.9, Disp: 100 each, Rfl: 12 .  lisinopril (PRINIVIL,ZESTRIL) 5 MG tablet, Take 1 tablet (5 mg total) by mouth daily., Disp: 90 tablet, Rfl: 1 .  lisinopril (PRINIVIL,ZESTRIL) 5 MG tablet, TAKE 1 TABLET BY MOUTH EVERY DAY, Disp: 30 tablet, Rfl: 3 .  rosuvastatin (CRESTOR) 20 MG tablet, Take 1 tablet (20 mg total) by mouth daily., Disp: 90 tablet, Rfl: 1 .  rosuvastatin (CRESTOR) 20 MG tablet, TAKE 1 TABLET BY MOUTH EVERY DAY, Disp: 30 tablet, Rfl: 3 .  sertraline (ZOLOFT) 100 MG tablet, TAKE 1 TABLET BY MOUTH EVERY DAY, Disp: 90 tablet, Rfl: 1 .  simvastatin (ZOCOR) 40 MG tablet, Take 1 tablet (40 mg total) by mouth daily at 6 PM. Take one by mouth daily., Disp: 30 tablet, Rfl: 0 .  traZODone (DESYREL) 150 MG tablet, TAKE 2 TABLETS AT BEDTIME AT NIGHT, Disp: 60 tablet, Rfl: 0 .  empagliflozin (JARDIANCE) 10 MG TABS tablet, Take 10 mg by mouth daily., Disp: 30  tablet, Rfl: 3    Family History  Problem Relation Age of Onset  . Dementia Mother   . Hypertension Father   . Hyperlipidemia Father   . Cancer Maternal Grandmother   . Dementia Maternal Grandfather   . Cancer Paternal Grandmother        colon  . Heart disease Paternal Grandfather      Social History   Tobacco Use  . Smoking status: Former Research scientist (life sciences)  . Smokeless tobacco: Never Used  Substance Use Topics  . Alcohol use: Yes    Alcohol/week: 6.0 oz    Types: 10 Shots of liquor per week    Comment: weekly  . Drug use: No    Allergies as of 02/09/2018  . (No Known Allergies)     Review of Systems:    All systems reviewed and negative except where noted in HPI.   Physical Exam:  BP 134/85   Pulse 90   Ht 5\' 8"  (1.727 m)   Wt 205 lb (93 kg)   BMI 31.17 kg/m  No LMP for male patient.  General:   Alert,  Well-developed, well-nourished, pleasant and cooperative in NAD Head:  Normocephalic and atraumatic. Eyes:  Sclera clear, no icterus.   Conjunctiva pink. Ears:  Normal auditory acuity. Nose:  No deformity, discharge, or lesions. Mouth:  No deformity or lesions,oropharynx pink & moist. Neck:  Supple; no masses or thyromegaly. Lungs:  Respirations even and unlabored.  Clear throughout to auscultation.   No wheezes, crackles, or rhonchi. No acute distress. Heart:  Regular rate and rhythm; no murmurs, clicks, rubs, or gallops. Abdomen:  Normal bowel sounds. Soft, abdominal obesity, non-tender and non-distended without masses, hepatosplenomegaly or hernias noted.  No guarding or rebound tenderness.   Rectal: Not performed Msk:  Symmetrical without gross deformities. Good, equal movement & strength bilaterally. Pulses:  Normal pulses noted. Extremities:  No clubbing or edema.  No cyanosis. Neurologic:  Alert and oriented x3;  grossly normal neurologically. Skin:  Intact without significant lesions or rashes. No jaundice. Lymph Nodes:  No significant cervical adenopathy. Psych:  Alert and cooperative. Normal mood and affect.  Imaging Studies: Ultrasound abdomen in 04/2015 showed fatty liver  Assessment and Plan:   Austin Bray is a 77 y.o. Caucasian male with metabolic syndrome seen in consultation for rectal bleeding and symptomatic hemorrhoids  Recommend colonoscopy for further evaluation Discussed with him about outpatient hemorrhoid ligation after the colonoscopy Highly encouraged him to avoid sodas, red meat, cut back on alcohol High-fiber diet Suggested him to find friends and stay active, find healthy coping strategies to help with his  loneliness   Follow up in 4 weeks   Austin Darby, MD

## 2018-02-10 ENCOUNTER — Other Ambulatory Visit: Payer: Self-pay

## 2018-02-10 DIAGNOSIS — K625 Hemorrhage of anus and rectum: Secondary | ICD-10-CM

## 2018-02-20 ENCOUNTER — Telehealth: Payer: Self-pay | Admitting: Gastroenterology

## 2018-02-20 NOTE — Telephone Encounter (Signed)
PT LEFT VM HE STATES HE NEEDS TO R/S HIS PROCEDURE FOR 02/21/18 HE NEVER GOT HIS RX AND HE NEEDS IT A WEEK OUT OR SO

## 2018-02-21 ENCOUNTER — Ambulatory Visit
Admission: RE | Admit: 2018-02-21 | Payer: BLUE CROSS/BLUE SHIELD | Source: Ambulatory Visit | Admitting: Gastroenterology

## 2018-02-21 ENCOUNTER — Encounter: Admission: RE | Payer: Self-pay | Source: Ambulatory Visit

## 2018-02-21 SURGERY — COLONOSCOPY WITH PROPOFOL
Anesthesia: General

## 2018-02-21 NOTE — Telephone Encounter (Signed)
LVM asking pt to call and reschedule colonoscopy

## 2018-02-22 ENCOUNTER — Other Ambulatory Visit: Payer: Self-pay

## 2018-02-22 DIAGNOSIS — K625 Hemorrhage of anus and rectum: Secondary | ICD-10-CM

## 2018-03-06 ENCOUNTER — Encounter: Admission: RE | Payer: Self-pay | Source: Ambulatory Visit

## 2018-03-06 ENCOUNTER — Ambulatory Visit
Admission: RE | Admit: 2018-03-06 | Payer: BLUE CROSS/BLUE SHIELD | Source: Ambulatory Visit | Admitting: Gastroenterology

## 2018-03-06 SURGERY — COLONOSCOPY WITH PROPOFOL
Anesthesia: General

## 2018-03-17 ENCOUNTER — Encounter: Payer: Self-pay | Admitting: Gastroenterology

## 2018-03-17 ENCOUNTER — Ambulatory Visit: Payer: BLUE CROSS/BLUE SHIELD | Admitting: Gastroenterology

## 2018-07-07 ENCOUNTER — Emergency Department: Payer: No Typology Code available for payment source

## 2018-07-07 ENCOUNTER — Emergency Department
Admission: EM | Admit: 2018-07-07 | Discharge: 2018-07-07 | Disposition: A | Discharge status: Home / Self Care | Payer: No Typology Code available for payment source | Attending: Emergency Medicine | Admitting: Emergency Medicine

## 2018-07-07 ENCOUNTER — Other Ambulatory Visit: Payer: Self-pay

## 2018-07-07 DIAGNOSIS — Z79899 Other long term (current) drug therapy: Secondary | ICD-10-CM | POA: Insufficient documentation

## 2018-07-07 DIAGNOSIS — S301XXA Contusion of abdominal wall, initial encounter: Secondary | ICD-10-CM | POA: Diagnosis not present

## 2018-07-07 DIAGNOSIS — Y9389 Activity, other specified: Secondary | ICD-10-CM | POA: Diagnosis not present

## 2018-07-07 DIAGNOSIS — N189 Chronic kidney disease, unspecified: Secondary | ICD-10-CM | POA: Diagnosis not present

## 2018-07-07 DIAGNOSIS — Y998 Other external cause status: Secondary | ICD-10-CM | POA: Diagnosis not present

## 2018-07-07 DIAGNOSIS — W2210XA Striking against or struck by unspecified automobile airbag, initial encounter: Secondary | ICD-10-CM | POA: Diagnosis not present

## 2018-07-07 DIAGNOSIS — Z87891 Personal history of nicotine dependence: Secondary | ICD-10-CM | POA: Diagnosis not present

## 2018-07-07 DIAGNOSIS — I129 Hypertensive chronic kidney disease with stage 1 through stage 4 chronic kidney disease, or unspecified chronic kidney disease: Secondary | ICD-10-CM | POA: Insufficient documentation

## 2018-07-07 DIAGNOSIS — Z7984 Long term (current) use of oral hypoglycemic drugs: Secondary | ICD-10-CM | POA: Insufficient documentation

## 2018-07-07 DIAGNOSIS — Y929 Unspecified place or not applicable: Secondary | ICD-10-CM | POA: Diagnosis not present

## 2018-07-07 DIAGNOSIS — M542 Cervicalgia: Secondary | ICD-10-CM | POA: Diagnosis not present

## 2018-07-07 DIAGNOSIS — E1122 Type 2 diabetes mellitus with diabetic chronic kidney disease: Secondary | ICD-10-CM | POA: Insufficient documentation

## 2018-07-07 DIAGNOSIS — S3991XA Unspecified injury of abdomen, initial encounter: Secondary | ICD-10-CM | POA: Diagnosis present

## 2018-07-07 LAB — CBC WITH DIFFERENTIAL/PLATELET
Abs Immature Granulocytes: 0.04 10*3/uL (ref 0.00–0.07)
Basophils Absolute: 0 10*3/uL (ref 0.0–0.1)
Basophils Relative: 0 %
Eosinophils Absolute: 0 10*3/uL (ref 0.0–0.5)
Eosinophils Relative: 0 %
HCT: 36.1 % — ABNORMAL LOW (ref 39.0–52.0)
Hemoglobin: 12.5 g/dL — ABNORMAL LOW (ref 13.0–17.0)
IMMATURE GRANULOCYTES: 0 %
Lymphocytes Relative: 8 %
Lymphs Abs: 0.7 10*3/uL (ref 0.7–4.0)
MCH: 30.9 pg (ref 26.0–34.0)
MCHC: 34.6 g/dL (ref 30.0–36.0)
MCV: 89.1 fL (ref 80.0–100.0)
MONOS PCT: 7 %
Monocytes Absolute: 0.6 10*3/uL (ref 0.1–1.0)
NEUTROS PCT: 85 %
Neutro Abs: 7.8 10*3/uL — ABNORMAL HIGH (ref 1.7–7.7)
Platelets: 140 10*3/uL — ABNORMAL LOW (ref 150–400)
RBC: 4.05 MIL/uL — ABNORMAL LOW (ref 4.22–5.81)
RDW: 12.1 % (ref 11.5–15.5)
WBC: 9.2 10*3/uL (ref 4.0–10.5)
nRBC: 0 % (ref 0.0–0.2)

## 2018-07-07 LAB — COMPREHENSIVE METABOLIC PANEL
ALT: 37 U/L (ref 0–44)
ANION GAP: 9 (ref 5–15)
AST: 57 U/L — ABNORMAL HIGH (ref 15–41)
Albumin: 3.8 g/dL (ref 3.5–5.0)
Alkaline Phosphatase: 45 U/L (ref 38–126)
BUN: 20 mg/dL (ref 8–23)
CO2: 21 mmol/L — ABNORMAL LOW (ref 22–32)
Calcium: 8.2 mg/dL — ABNORMAL LOW (ref 8.9–10.3)
Chloride: 102 mmol/L (ref 98–111)
Creatinine, Ser: 1.22 mg/dL (ref 0.61–1.24)
GFR calc Af Amer: >60 mL/min (ref >60)
GFR calc non Af Amer: 57 mL/min — ABNORMAL LOW (ref >60)
GLUCOSE: 394 mg/dL — AB (ref 70–99)
Potassium: 3.7 mmol/L (ref 3.5–5.1)
Sodium: 132 mmol/L — ABNORMAL LOW (ref 135–145)
Total Bilirubin: 0.7 mg/dL (ref 0.3–1.2)
Total Protein: 6.5 g/dL (ref 6.5–8.1)

## 2018-07-07 LAB — TYPE AND SCREEN
ABO/RH(D): AB POS
Antibody Screen: NEGATIVE

## 2018-07-07 LAB — ETHANOL: Alcohol, Ethyl (B): <10 mg/dL (ref <10)

## 2018-07-07 LAB — PROTIME-INR
INR: 0.99
Prothrombin Time: 13 seconds (ref 11.4–15.2)

## 2018-07-07 MED ORDER — IOPAMIDOL (ISOVUE-300) INJECTION 61%
100.0000 mL | Freq: Once | INTRAVENOUS | Status: AC | PRN
  Administered 2018-07-07: 100 mL via INTRAVENOUS

## 2018-07-07 MED ORDER — FENTANYL CITRATE (PF) 100 MCG/2ML IJ SOLN
50.0000 ug | Freq: Once | INTRAMUSCULAR | Status: AC
  Administered 2018-07-07: 50 ug via INTRAVENOUS
  Filled 2018-07-07: qty 2
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
## 2018-07-07 NOTE — ED Notes (Signed)
Patient transported to CT 
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
## 2018-07-07 NOTE — Discharge Instructions (Addendum)
Your ct showed: ACDF C4 through C7. 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 Probable solid fusion C5-6 and C6-7. Extensive spurring and facet degeneration causing significant foraminal encroachment at multiple levels as described Above. Which is arthritic changes in your neck and requires that you follow up with your doctor. There is also a small nodule in your lung. It is tiny, please let your pcp know, you may need ct in one year.  We have offered you admission to the hospital, but you would prefer not to stay, that certainly is your choice but we do asked that you remain vigilant with your family, if you have increased pain in your abdomen, vomiting, or any other new or worrisome symptoms please return to the urgency department.  Stay with family.  Take Tylenol for the pain.  You may also take Motrin.  Please follow closely with your doctor on Monday.

## 2018-07-07 NOTE — ED Notes (Signed)
Pt ambulatory to toilet w/ standyby assist.

## 2018-12-27 ENCOUNTER — Ambulatory Visit: Payer: Self-pay | Admitting: Family Medicine

## 2018-12-27 ENCOUNTER — Ambulatory Visit: Payer: BLUE CROSS/BLUE SHIELD

## 2018-12-27 ENCOUNTER — Ambulatory Visit: Payer: BLUE CROSS/BLUE SHIELD | Admitting: Family Medicine

## 2018-12-29 ENCOUNTER — Ambulatory Visit (INDEPENDENT_AMBULATORY_CARE_PROVIDER_SITE_OTHER): Payer: Self-pay

## 2018-12-29 ENCOUNTER — Other Ambulatory Visit: Payer: Self-pay

## 2018-12-29 DIAGNOSIS — Z0000 Encounter for general adult medical examination without abnormal findings: Secondary | ICD-10-CM

## 2018-12-29 NOTE — Patient Instructions (Addendum)
  Austin Bray , Thank you for taking time to come for your Medicare Wellness Visit. I appreciate your ongoing commitment to your health goals. Please review the following plan we discussed and let me know if I can assist you in the future.   These are the goals we discussed: Goals      Patient Stated   . Increase physical activity (pt-stated)     Walk more for exercise       This is a list of the screening recommended for you and due dates:  Health Maintenance  Topic Date Due  . Tetanus Vaccine  07/31/1959  . Pneumonia vaccines (1 of 2 - PCV13) 07/30/2005  . Eye exam for diabetics  05/19/2016  . Hemoglobin A1C  06/27/2018  . Complete foot exam   12/27/2018  . Urine Protein Check  12/27/2018  . Flu Shot  02/17/2019

## 2018-12-29 NOTE — Progress Notes (Signed)
Subjective:   Austin Bray is a 78 y.o. male who presents for Medicare Annual/Subsequent preventive examination.  Review of Systems:  No ROS.  Medicare Wellness Virtual Visit.  Visual/audio telehealth visit, UTA vital signs.   See social history for additional risk factors.   Cardiac Risk Factors include: advanced age (>45men, >31 women);hypertension;male gender;diabetes mellitus     Objective:    Vitals: There were no vitals taken for this visit.  There is no height or weight on file to calculate BMI.  Advanced Directives 12/29/2018 07/07/2018 12/22/2017 08/24/2017 12/21/2016  Does Patient Have a Medical Advance Directive? Yes No No No No  Type of Paramedic of [place];Living will - - - -  Does patient want to make changes to medical advance directive? No - Patient declined - - - -  Copy of [place] in Chart? No - copy requested - - - -  Would patient like information on creating a medical advance directive? - No - Patient declined Yes (MAU/Ambulatory/Procedural Areas - Information given) Yes (MAU/Ambulatory/Procedural Areas - Information given) Yes (MAU/Ambulatory/Procedural Areas - Information given)    Tobacco Social History   Tobacco Use  Smoking Status Former Smoker  Smokeless Tobacco Never Used     Counseling given: Not Answered   Clinical Intake:  Pre-visit preparation completed: Yes        Diabetes: Yes  How often do you need to have someone help you when you read instructions, pamphlets, or other written materials from your doctor or pharmacy?: 1 - Never  Interpreter Needed?: No     Past Medical History:  Diagnosis Date  . Chronic kidney disease    stones 30 years ago  . Depression   . Diabetes mellitus without complication ([place])   . GERD (gastroesophageal reflux disease)   . Hyperlipidemia   . Hypertension    Past Surgical History:  Procedure Laterality Date  . ANTERIOR CERVICAL DISCECTOMY  2015  .  TONSILLECTOMY     Family History  Problem Relation Age of Onset  . Dementia Mother   . Hypertension Father   . Hyperlipidemia Father   . Cancer Maternal Grandmother   . Dementia Maternal Grandfather   . Cancer Paternal Grandmother        colon  . Heart disease Paternal Grandfather    Social History   Socioeconomic History  . Marital status: Widowed    Spouse name: Not on file  . Number of children: Not on file  . Years of education: Not on file  . Highest education level: Not on file  Occupational History  . Not on file  Social Needs  . Financial resource strain: Not hard at all  . Food insecurity    Worry: Never true    Inability: Never true  . Transportation needs    Medical: No    Non-medical: No  Tobacco Use  . Smoking status: Former Research scientist (life sciences)  . Smokeless tobacco: Never Used  Substance and Sexual Activity  . Alcohol use: Yes    Alcohol/week: 10.0 standard drinks    Types: 10 Shots of liquor per week    Comment: weekly  . Drug use: No  . Sexual activity: Never  Lifestyle  . Physical activity    Days per week: Not on file    Minutes per session: Not on file  . Stress: Only a little  Relationships  . Social connections    Talks on phone: Not on file  Gets together: Not on file    Attends religious service: Not on file    Active member of club or organization: Not on file    Attends meetings of clubs or organizations: Not on file    Relationship status: Not on file  Other Topics Concern  . Not on file  Social History Narrative  . Not on file    Outpatient Encounter Medications as of 12/29/2018  Medication Sig  . omeprazole (PRILOSEC OTC) 20 MG tablet Take 20 mg by mouth daily.  . [DISCONTINUED] ALPRAZolam (XANAX) 0.25 MG tablet Take 1 tablet (0.25 mg total) by mouth 2 (two) times daily as needed for anxiety.  . [DISCONTINUED] empagliflozin (JARDIANCE) 10 MG TABS tablet Take 10 mg by mouth daily.  . [DISCONTINUED] glucose blood test strip Use as  instructed to check Blood glucose up to three times a day. Dispense contour plus strips.E11.9  . [DISCONTINUED] lisinopril (PRINIVIL,ZESTRIL) 5 MG tablet Take 1 tablet (5 mg total) by mouth daily.  . [DISCONTINUED] rosuvastatin (CRESTOR) 20 MG tablet Take 1 tablet (20 mg total) by mouth daily.  . [DISCONTINUED] sertraline (ZOLOFT) 100 MG tablet TAKE 1 TABLET BY MOUTH EVERY DAY  . [DISCONTINUED] simvastatin (ZOCOR) 40 MG tablet Take 1 tablet (40 mg total) by mouth daily at 6 PM. Take one by mouth daily.  . traZODone (DESYREL) 150 MG tablet TAKE 2 TABLETS AT BEDTIME AT NIGHT   No facility-administered encounter medications on file as of 12/29/2018.     Activities of Daily Living In your present state of health, do you have any difficulty performing the following activities: 12/29/2018  Hearing? N  Vision? N  Difficulty concentrating or making decisions? N  Walking or climbing stairs? N  Dressing or bathing? N  Doing errands, shopping? N  Preparing Food and eating ? N  Using the Toilet? N  In the past six months, have you accidently leaked urine? N  Do you have problems with loss of bowel control? N  Managing your Medications? N  Managing your Finances? N  Housekeeping or managing your Housekeeping? N  Some recent data might be hidden    Patient Care Team: Leone Haven, MD as PCP - General (Family Medicine) Bary Castilla, Forest Gleason, MD (General Surgery) Crecencio Mc, MD (Internal Medicine)   Assessment:   This is a routine wellness examination for Austin Bray.  I connected with patient 12/29/18 at  1:30 PM EDT by a video/audio enabled telemedicine application and verified that I am speaking with the correct person using two identifiers. Patient stated full name and DOB. Patient gave permission to continue with virtual visit. Patient's location was at home and Nurse's location was at [place] office.   No longer taking scheduled medications, per patient preference. Removed all from list  today except trazodone of which he plans to further discuss with his PMD.    Depression- Notes spouse died about 2 years ago and he has since spent most of his last year living in [place]. He is not taking any anti-depressant medication per his choice. States he is not doing any better or worse regarding the grief process.  He denies harm to self or others.  He does not participate in counseling. Appetite is good. He does not sleep well at bedtime and believes this is the biggest issue today. Appointment with his doctor offered for follow up, he declines. Prefers to wait for follow up at next scheduled appointment.   Diabetes- he does not monitor his blood  sugar.  States he watches what he eats and feels overall ok with diet and exercise.   Declines all appointments offered with his doctor prior to previously scheduled follow up.   Agrees to keep all routine maintenance appointments. Next scheduled appointment 02/2019.   Health Screenings  Glaucoma -none Hearing -demonstrates normal hearing during visit. Hemoglobin A1C - 12/2017 Cholesterol - 12/2017 Dental- self care Vision- wears glasses  Social  Alcohol intake - yes      Smoking history- former   Smokers in home? none Illicit drug use? none Exercise - walking Diet - high protein, low sugar, grains Sexually Active -not currently BMI- discussed the importance of a healthy diet, water intake and the benefits of aerobic exercise.  Educational material provided.   Safety  Patient feels safe at home- yes Patient does have smoke detectors at home- yes Patient does wear sunscreen or protective clothing when in direct sunlight -yes Patient does wear seat belt when in a moving vehicle -yes  Covid-19 precautions and sickness symptoms discussed.   Activities of Daily Living Patient denies needing assistance with: driving, household chores, feeding themselves, getting from bed to chair, getting to the toilet, bathing/showering,  dressing, managing money, or preparing meals.  No new identified risk were noted.    Fall Screen Patient denies being afraid of falling or falling in the last year.   Memory Screen Patient is alert.  Patient denies difficulty focusing, concentrating or misplacing items. Correctly identified the president of the [place], season and recall. Plays musical instrument and sings for brain stimulation.   Immunizations The following Immunizations were dscussed: Influenza, shingles, pneumonia, and tetanus.   Other Providers Patient Care Team: Leone Haven, MD as PCP - General (Family Medicine) Bary Castilla, Forest Gleason, MD (General Surgery) Crecencio Mc, MD (Internal Medicine)  Exercise Activities and Dietary recommendations Current Exercise Habits: Home exercise routine, Type of exercise: walking, Intensity: Mild  Goals      Patient Stated   . Increase physical activity (pt-stated)     Walk more for exercise       Fall Risk Fall Risk  12/29/2018 12/22/2017 12/21/2016 10/29/2016 04/21/2015  Falls in the past year? 0 No No No No   Depression Screen PHQ 2/9 Scores 12/29/2018 12/22/2017 12/21/2016 10/29/2016  PHQ - 2 Score 1 1 3  0  PHQ- 9 Score - - 9 -    Cognitive Function MMSE - Mini Mental State Exam 12/22/2017 12/21/2016  Orientation to time 5 5  Orientation to Place 5 5  Registration 3 3  Attention/ Calculation 5 5  Recall 2 3  Language- name 2 objects 2 2  Language- repeat 1 1  Language- follow 3 step command 3 3  Language- read & follow direction 1 1  Write a sentence 1 1  Copy design 1 1  Total score 29 30     6CIT Screen 12/29/2018  What Year? 0 points  What month? 0 points  What time? 0 points  Count back from 20 0 points  Months in reverse 0 points    Immunization History  Administered Date(s) Administered  . Hep A / Hep B 05/27/2015  . Influenza Whole 04/24/2009  . Influenza, High Dose Seasonal PF 07/04/2017  . Influenza,inj,Quad PF,6+ Mos 05/17/2014, 05/27/2015    Screening Tests Health Maintenance  Topic Date Due  . TETANUS/TDAP  07/31/1959  . PNA vac Low Risk Adult (1 of 2 - PCV13) 07/30/2005  . OPHTHALMOLOGY EXAM  05/19/2016  . HEMOGLOBIN A1C  06/27/2018  . FOOT EXAM  12/27/2018  . URINE MICROALBUMIN  12/27/2018  . INFLUENZA VACCINE  02/17/2019       Plan:    End of life planning; Advance aging; Advanced directives discussed.  Copy of current HCPOA/Living Will requested.    I have personally reviewed and noted the following in the patient's chart:   . Medical and social history . Use of alcohol, tobacco or illicit drugs  . Current medications and supplements . Functional ability and status . Nutritional status . Physical activity . Advanced directives . List of other physicians . Hospitalizations, surgeries, and ER visits in previous 12 months . Vitals . Screenings to include cognitive, depression, and falls . Referrals and appointments  In addition, I have reviewed and discussed with patient certain preventive protocols, quality metrics, and best practice recommendations. A written personalized care plan for preventive services as well as general preventive health recommendations were provided to patient.     Varney Biles, LPN  [id number]/83/[id number]

## 2019-01-07 NOTE — Progress Notes (Signed)
I have reviewed the above note and agree.  Christinamarie Tall, M.D.  

## 2019-01-08 ENCOUNTER — Telehealth: Payer: Self-pay | Admitting: Family Medicine

## 2019-01-08 NOTE — Telephone Encounter (Signed)
Left message to call the office to update insurance information.

## 2019-01-08 NOTE — Progress Notes (Signed)
Left the patient a message to call the office to update his insurance information.

## 2019-03-05 ENCOUNTER — Encounter: Payer: Self-pay | Admitting: Family Medicine

## 2019-03-05 DIAGNOSIS — Z0289 Encounter for other administrative examinations: Secondary | ICD-10-CM

## 2019-12-26 ENCOUNTER — Telehealth: Payer: Self-pay | Admitting: Family Medicine

## 2019-12-26 NOTE — Telephone Encounter (Signed)
[place]

## 2019-12-26 NOTE — Telephone Encounter (Signed)
-----   Message from Salvatore Marvel sent at 12/25/2019  2:44 PM EDT ----- Regarding: [place]cp

## 2020-01-01 ENCOUNTER — Ambulatory Visit: Payer: Self-pay
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
